# Patient Record
Sex: Male | Born: 1935
Health system: Southern US, Community
[De-identification: ages and names within clinical notes are randomized; demographics above are authoritative.]

## PROBLEM LIST (undated history)

## (undated) DIAGNOSIS — G8929 Other chronic pain: Secondary | ICD-10-CM

## (undated) DIAGNOSIS — R609 Edema, unspecified: Secondary | ICD-10-CM

## (undated) DIAGNOSIS — G20 Parkinson's disease: Secondary | ICD-10-CM

## (undated) DIAGNOSIS — G20A1 Parkinson's disease without dyskinesia, without mention of fluctuations: Secondary | ICD-10-CM

## (undated) DIAGNOSIS — G629 Polyneuropathy, unspecified: Secondary | ICD-10-CM

## (undated) DIAGNOSIS — I10 Essential (primary) hypertension: Secondary | ICD-10-CM

## (undated) HISTORY — DX: Other chronic pain: G89.29

## (undated) HISTORY — PX: BRAIN SURGERY: SHX531

## (undated) HISTORY — PX: BACK SURGERY: SHX140

## (undated) HISTORY — PX: CORONARY ANGIOPLASTY WITH STENT PLACEMENT: SHX49

---

## 2012-12-16 ENCOUNTER — Ambulatory Visit (HOSPITAL_COMMUNITY)
Admission: RE | Admit: 2012-12-16 | Discharge: 2012-12-16 | Disposition: A | Discharge status: Home / Self Care | Payer: Medicare HMO | Source: Ambulatory Visit | Attending: Family Medicine | Admitting: Family Medicine

## 2012-12-16 ENCOUNTER — Other Ambulatory Visit (HOSPITAL_COMMUNITY): Payer: Self-pay | Admitting: Family Medicine

## 2012-12-16 DIAGNOSIS — M545 Low back pain, unspecified: Secondary | ICD-10-CM | POA: Insufficient documentation

## 2012-12-16 DIAGNOSIS — M5137 Other intervertebral disc degeneration, lumbosacral region: Secondary | ICD-10-CM | POA: Insufficient documentation

## 2012-12-16 DIAGNOSIS — R29898 Other symptoms and signs involving the musculoskeletal system: Secondary | ICD-10-CM

## 2012-12-16 DIAGNOSIS — M51379 Other intervertebral disc degeneration, lumbosacral region without mention of lumbar back pain or lower extremity pain: Secondary | ICD-10-CM | POA: Insufficient documentation

## 2013-05-08 ENCOUNTER — Other Ambulatory Visit (HOSPITAL_COMMUNITY): Payer: Self-pay | Admitting: Family Medicine

## 2013-05-08 DIAGNOSIS — R29898 Other symptoms and signs involving the musculoskeletal system: Secondary | ICD-10-CM

## 2013-05-08 DIAGNOSIS — M545 Low back pain: Secondary | ICD-10-CM

## 2013-05-09 ENCOUNTER — Other Ambulatory Visit (HOSPITAL_COMMUNITY): Payer: Self-pay | Admitting: Family Medicine

## 2013-05-09 ENCOUNTER — Ambulatory Visit (HOSPITAL_COMMUNITY)
Admission: RE | Admit: 2013-05-09 | Discharge: 2013-05-09 | Disposition: A | Discharge status: Home / Self Care | Payer: Medicare HMO | Source: Ambulatory Visit | Attending: Family Medicine | Admitting: Family Medicine

## 2013-05-09 DIAGNOSIS — M545 Low back pain, unspecified: Secondary | ICD-10-CM

## 2013-05-09 DIAGNOSIS — R29898 Other symptoms and signs involving the musculoskeletal system: Secondary | ICD-10-CM

## 2013-05-09 DIAGNOSIS — Z9889 Other specified postprocedural states: Secondary | ICD-10-CM | POA: Insufficient documentation

## 2013-05-09 DIAGNOSIS — M47817 Spondylosis without myelopathy or radiculopathy, lumbosacral region: Secondary | ICD-10-CM | POA: Insufficient documentation

## 2013-05-09 DIAGNOSIS — R5381 Other malaise: Secondary | ICD-10-CM | POA: Insufficient documentation

## 2013-05-09 DIAGNOSIS — M8448XA Pathological fracture, other site, initial encounter for fracture: Secondary | ICD-10-CM | POA: Insufficient documentation

## 2013-05-10 ENCOUNTER — Other Ambulatory Visit (HOSPITAL_COMMUNITY): Payer: Medicare HMO

## 2013-05-19 ENCOUNTER — Other Ambulatory Visit: Payer: Self-pay | Admitting: Family Medicine

## 2013-05-19 DIAGNOSIS — M5136 Other intervertebral disc degeneration, lumbar region: Secondary | ICD-10-CM

## 2013-06-07 ENCOUNTER — Ambulatory Visit
Admission: RE | Admit: 2013-06-07 | Discharge: 2013-06-07 | Disposition: A | Discharge status: Home / Self Care | Payer: Medicare HMO | Source: Ambulatory Visit | Attending: Family Medicine | Admitting: Family Medicine

## 2013-06-07 DIAGNOSIS — M5136 Other intervertebral disc degeneration, lumbar region: Secondary | ICD-10-CM

## 2013-06-07 MED ORDER — IOHEXOL 180 MG/ML  SOLN
1.0000 mL | Freq: Once | INTRAMUSCULAR | Status: AC | PRN
  Administered 2013-06-07: 1 mL via EPIDURAL

## 2013-06-07 MED ORDER — METHYLPREDNISOLONE ACETATE 40 MG/ML INJ SUSP (RADIOLOG
120.0000 mg | Freq: Once | INTRAMUSCULAR | Status: AC
  Administered 2013-06-07: 120 mg via EPIDURAL

## 2013-06-28 ENCOUNTER — Inpatient Hospital Stay (HOSPITAL_COMMUNITY)
Admission: EM | Admit: 2013-06-28 | Discharge: 2013-07-01 | DRG: 558 | Disposition: A | Discharge status: Skilled Nursing Facility | Payer: Medicare HMO | Attending: Family Medicine | Admitting: Family Medicine

## 2013-06-28 DIAGNOSIS — G20A1 Parkinson's disease without dyskinesia, without mention of fluctuations: Secondary | ICD-10-CM | POA: Diagnosis present

## 2013-06-28 DIAGNOSIS — R531 Weakness: Secondary | ICD-10-CM

## 2013-06-28 DIAGNOSIS — D696 Thrombocytopenia, unspecified: Secondary | ICD-10-CM | POA: Diagnosis present

## 2013-06-28 DIAGNOSIS — M5137 Other intervertebral disc degeneration, lumbosacral region: Secondary | ICD-10-CM | POA: Diagnosis present

## 2013-06-28 DIAGNOSIS — E538 Deficiency of other specified B group vitamins: Secondary | ICD-10-CM | POA: Diagnosis present

## 2013-06-28 DIAGNOSIS — R7989 Other specified abnormal findings of blood chemistry: Secondary | ICD-10-CM

## 2013-06-28 DIAGNOSIS — N179 Acute kidney failure, unspecified: Secondary | ICD-10-CM | POA: Diagnosis present

## 2013-06-28 DIAGNOSIS — R17 Unspecified jaundice: Secondary | ICD-10-CM

## 2013-06-28 DIAGNOSIS — G629 Polyneuropathy, unspecified: Secondary | ICD-10-CM

## 2013-06-28 DIAGNOSIS — M47817 Spondylosis without myelopathy or radiculopathy, lumbosacral region: Secondary | ICD-10-CM | POA: Diagnosis present

## 2013-06-28 DIAGNOSIS — Z9181 History of falling: Secondary | ICD-10-CM

## 2013-06-28 DIAGNOSIS — I10 Essential (primary) hypertension: Secondary | ICD-10-CM | POA: Diagnosis present

## 2013-06-28 DIAGNOSIS — W19XXXA Unspecified fall, initial encounter: Secondary | ICD-10-CM

## 2013-06-28 DIAGNOSIS — R740 Nonspecific elevation of levels of transaminase and lactic acid dehydrogenase [LDH]: Secondary | ICD-10-CM

## 2013-06-28 DIAGNOSIS — M51379 Other intervertebral disc degeneration, lumbosacral region without mention of lumbar back pain or lower extremity pain: Secondary | ICD-10-CM | POA: Diagnosis present

## 2013-06-28 DIAGNOSIS — M6282 Rhabdomyolysis: Principal | ICD-10-CM | POA: Diagnosis present

## 2013-06-28 DIAGNOSIS — D72829 Elevated white blood cell count, unspecified: Secondary | ICD-10-CM | POA: Diagnosis present

## 2013-06-28 DIAGNOSIS — N390 Urinary tract infection, site not specified: Secondary | ICD-10-CM | POA: Diagnosis present

## 2013-06-28 DIAGNOSIS — M1990 Unspecified osteoarthritis, unspecified site: Secondary | ICD-10-CM | POA: Diagnosis present

## 2013-06-28 DIAGNOSIS — G20 Parkinson's disease: Secondary | ICD-10-CM | POA: Diagnosis present

## 2013-06-28 DIAGNOSIS — D649 Anemia, unspecified: Secondary | ICD-10-CM | POA: Diagnosis present

## 2013-06-28 DIAGNOSIS — N289 Disorder of kidney and ureter, unspecified: Secondary | ICD-10-CM

## 2013-06-28 DIAGNOSIS — Z8249 Family history of ischemic heart disease and other diseases of the circulatory system: Secondary | ICD-10-CM

## 2013-06-28 DIAGNOSIS — R609 Edema, unspecified: Secondary | ICD-10-CM | POA: Diagnosis present

## 2013-06-28 DIAGNOSIS — G589 Mononeuropathy, unspecified: Secondary | ICD-10-CM | POA: Diagnosis present

## 2013-06-28 DIAGNOSIS — R7401 Elevation of levels of liver transaminase levels: Secondary | ICD-10-CM

## 2013-06-28 HISTORY — DX: Essential (primary) hypertension: I10

## 2013-06-28 HISTORY — DX: Parkinson's disease: G20

## 2013-06-28 HISTORY — DX: Parkinson's disease without dyskinesia, without mention of fluctuations: G20.A1

## 2013-06-28 HISTORY — DX: Edema, unspecified: R60.9

## 2013-06-28 HISTORY — DX: Polyneuropathy, unspecified: G62.9

## 2013-06-28 MED ORDER — SODIUM CHLORIDE 0.9 % IV SOLN: Freq: Once | INTRAVENOUS | Status: DC

## 2013-06-28 NOTE — ED Notes (Signed)
Pt in by ems from home with apparent fall early in the day, may have been on the floor for about 12 hours per family.  Pt c/o generalized weakness and lower back pain (chronic) and pt states he has been having right flank pain, low grade feves.

## 2013-06-28 NOTE — ED Provider Notes (Signed)
CSN: [id number]     Arrival date & time 06/28/13  2337 History  This chart was scribed for Delora Fuel, MD by Rolanda Lundborg, ED Scribe. This patient was seen in room APA01/APA01 and the patient's care was started at 11:44 PM.   Chief Complaint  Patient presents with  . Fall  . Weakness   The history is provided by the patient. No language interpreter was used.   HPI Comments: Julian Ramirez is a 80 y.o. male brought in by ambulance who presents to the Emergency Department complaining of falling out of bed earlier today and increased weakness in the last 2 days. Per son, pt may have been on the floor for about 12 hours before he was found. Pt states he was only on the floor for 2 hours. Pt states his legs gave out on him. He was verbally diagnosed with Parkinson's at the [place] 6 weeks ago. Son states his weakness has been ongoing but has become significantly worse in the last 2 days. Son reports his speech is more slurred than normal. Pt reports he urinated in the bed last night. Pt also reports lower back pain that is chronic in nature. Son reports he felt hot since yesterday but has not taken his temperature.   PCP - Lanette Hampshire, MD   No past medical history on file. No past surgical history on file. No family history on file. History  Substance Use Topics  . Smoking status: Not on file  . Smokeless tobacco: Not on file  . Alcohol Use: Not on file    Review of Systems  Constitutional: Positive for fever.  Genitourinary: Positive for enuresis.  Musculoskeletal: Positive for back pain.  Neurological: Positive for weakness.  All other systems reviewed and are negative.    Allergies  Penicillins  Home Medications  No current outpatient prescriptions on file. BP 109/71  Pulse 80  Temp(Src) 99.7 F (37.6 C) (Oral)  Resp 20  Ht 6\' 1"  (1.854 m)  Wt 210 lb (95.255 kg)  BMI 27.71 kg/m2  SpO2 95% Physical Exam  Nursing note and vitals reviewed. Constitutional: He is oriented  to person, place, and time. He appears well-developed and well-nourished. No distress.  HENT:  Head: Normocephalic and atraumatic.  Mucous membranes dry.   Eyes: EOM are normal.  Neck: Neck supple. No JVD present. No tracheal deviation present.  Cardiovascular: Normal rate and regular rhythm.   No murmur heard. Pulmonary/Chest: Effort normal and breath sounds normal. He has no wheezes. He has no rales.  Abdominal: Soft. Bowel sounds are normal. He exhibits no mass. There is no tenderness.  Musculoskeletal: Normal range of motion.  1+ pedal edema. Mild tenderness mid lumbar area.   Lymphadenopathy:    He has no cervical adenopathy.  Neurological: He is alert and oriented to person, place, and time. No cranial nerve deficit.  Slow to answer questions but oriented. Speech is mildly dysarthric. Moderate tremor present. Mild weakness of right arm 4/5. Moderate weakness of right leg 3/5. Negative pronator drift.   Skin: Skin is warm and dry. No rash noted.  Psychiatric: He has a normal mood and affect. His behavior is normal.    ED Course  Procedures (including critical care time) Medications  0.9 %  sodium chloride infusion (not administered)    DIAGNOSTIC STUDIES: Oxygen Saturation is 95% on RA, normal by my interpretation.    COORDINATION OF CARE: 11:53 PM- Discussed treatment plan with pt which includes basic labs, UA, imaging. Pt agrees  to plan.    Labs Review Results for orders placed during the hospital encounter of 06/28/13  CBC WITH DIFFERENTIAL      Result Value Range   WBC 12.0 (*) 4.0 - 10.5 K/uL   RBC 3.89 (*) 4.22 - 5.81 MIL/uL   Hemoglobin 12.3 (*) 13.0 - 17.0 g/dL   HCT 35.6 (*) 39.0 - 52.0 %   MCV 91.5  78.0 - 100.0 fL   MCH 31.6  26.0 - 34.0 pg   MCHC 34.6  30.0 - 36.0 g/dL   RDW 13.2  11.5 - 15.5 %   Platelets 126 (*) 150 - 400 K/uL   Neutrophils Relative % 94 (*) 43 - 77 %   Neutro Abs 11.2 (*) 1.7 - 7.7 K/uL   Lymphocytes Relative 3 (*) 12 - 46 %    Lymphs Abs 0.3 (*) 0.7 - 4.0 K/uL   Monocytes Relative 3  3 - 12 %   Monocytes Absolute 0.4  0.1 - 1.0 K/uL   Eosinophils Relative 0  0 - 5 %   Eosinophils Absolute 0.0  0.0 - 0.7 K/uL   Basophils Relative 0  0 - 1 %   Basophils Absolute 0.0  0.0 - 0.1 K/uL  COMPREHENSIVE METABOLIC PANEL      Result Value Range   Sodium 141  137 - 147 mEq/L   Potassium 4.3  3.7 - 5.3 mEq/L   Chloride 102  96 - 112 mEq/L   CO2 25  19 - 32 mEq/L   Glucose, Bld 119 (*) 70 - 99 mg/dL   BUN 38 (*) 6 - 23 mg/dL   Creatinine, Ser 1.56 (*) 0.50 - 1.35 mg/dL   Calcium 9.1  8.4 - 10.5 mg/dL   Total Protein 7.1  6.0 - 8.3 g/dL   Albumin 3.6  3.5 - 5.2 g/dL   AST 60 (*) 0 - 37 U/L   ALT 17  0 - 53 U/L   Alkaline Phosphatase 51  39 - 117 U/L   Total Bilirubin 1.9 (*) 0.3 - 1.2 mg/dL   GFR calc non Af Amer 39 (*) >90 mL/min   GFR calc Af Amer 45 (*) >90 mL/min  URINALYSIS, ROUTINE W REFLEX MICROSCOPIC      Result Value Range   Color, Urine YELLOW  YELLOW   APPearance CLEAR  CLEAR   Specific Gravity, Urine 1.025  1.005 - 1.030   pH 6.0  5.0 - 8.0   Glucose, UA NEGATIVE  NEGATIVE mg/dL   Hgb urine dipstick LARGE (*) NEGATIVE   Bilirubin Urine NEGATIVE  NEGATIVE   Ketones, ur NEGATIVE  NEGATIVE mg/dL   Protein, ur 100 (*) NEGATIVE mg/dL   Urobilinogen, UA 0.2  0.0 - 1.0 mg/dL   Nitrite NEGATIVE  NEGATIVE   Leukocytes, UA NEGATIVE  NEGATIVE  LACTIC ACID, PLASMA      Result Value Range   Lactic Acid, Venous 2.7 (*) 0.5 - 2.2 mmol/L  CK      Result Value Range   Total CK 3665 (*) 7 - 232 U/L  URINE MICROSCOPIC-ADD ON      Result Value Range   Squamous Epithelial / LPF MANY (*) RARE   RBC / HPF 0-2  <3 RBC/hpf   Bacteria, UA MANY (*) RARE   Urine-Other MUCOUS PRESENT     Imaging Review Ct Head Wo Contrast  06/29/2013   CLINICAL DATA:  Fall.  Weakness.  EXAM: CT HEAD WITHOUT CONTRAST  TECHNIQUE: Contiguous axial images were  obtained from the base of the skull through the vertex without intravenous  contrast.  COMPARISON:  None.  FINDINGS: Skull and Sinuses:Presumed mucous retention cyst in the anterior left maxillary antrum. Previous biparietal bur holes, often for subdural drainage.  Orbits: Bilateral cataract resection.  Brain: No evidence of acute abnormality, such as acute infarction, hemorrhage, hydrocephalus, or mass lesion/mass effect. Cerebral volume loss which is age appropriate. 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  IMPRESSION: No evidence of acute intracranial disease.   Electronically Signed   By: Jorje Guild M.D.   On: 06/29/2013 02:46   Dg Chest Port 1 View  06/29/2013   CLINICAL DATA:  Weakness.  EXAM: PORTABLE CHEST - 1 VIEW  COMPARISON:  None.  FINDINGS: Borderline cardiomegaly. Tortuous thoracic aorta. Crease markings at the bases, without definite consolidation. No effusion or pneumothorax.  IMPRESSION: Mild basilar atelectasis.   Electronically Signed   By: Jorje Guild M.D.   On: 06/29/2013 00:58   Images viewed by me.  EKG Interpretation    Date/Time:  Thursday June 29 2013 00:02:14 EST Ventricular Rate:  73 PR Interval:  168 QRS Duration: 86 QT Interval:  376 QTC Calculation: 414 R Axis:   11 Text Interpretation:  Normal sinus rhythm Normal ECG No previous ECGs available Confirmed by [facility]  MD, Olumide Dolinger ([id number]) on 06/29/2013 12:30:13 AM            MDM   1. Rhabdomyolysis   2. Weakness   3. Renal insufficiency   4. Anemia   5. Thrombocytopenia   6. Elevated bilirubin   7. Elevated transaminase level   8. Elevated lactic acid level    Weakness of uncertain cause. It does seem to be some asymmetry to the weakness in concerned about possibility of an underlying stroke. He was laying on the floor for an extended period of time she needs to be evaluated for possible rhabdomyolysis. Screening labs were obtained including CK and lactic acid and he was sent for CT of head and cervical spine and chest x-ray obtained.  Workup  is significant for elevated CK consistent with rhabdomyolysis. Melina Modena is noted as well as many epithelial cells so is uncertain if this actually represents UTI. Mild anemia and mild renal insufficiency are present without baseline values with which to compare. He is started on IV hydration. Case is discussed with Dr. Reece Levy [facility] who agrees to admit the patient. Mild elevation of lactic acid should be treated adequately with his IV hydration.  I personally performed the services described in this documentation, which was scribed in my presence. The recorded information has been reviewed and is accurate.     Delora Fuel, MD [id number]

## 2013-06-29 ENCOUNTER — Encounter (HOSPITAL_COMMUNITY): Payer: Self-pay | Admitting: Emergency Medicine

## 2013-06-29 ENCOUNTER — Emergency Department (HOSPITAL_COMMUNITY): Payer: Medicare HMO

## 2013-06-29 ENCOUNTER — Inpatient Hospital Stay (HOSPITAL_COMMUNITY): Payer: Medicare HMO

## 2013-06-29 ENCOUNTER — Inpatient Hospital Stay (HOSPITAL_COMMUNITY)
Admit: 2013-06-29 | Discharge: 2013-06-29 | Disposition: A | Discharge status: Home / Self Care | Payer: Medicare HMO | Attending: Neurology | Admitting: Neurology

## 2013-06-29 DIAGNOSIS — G20 Parkinson's disease: Secondary | ICD-10-CM | POA: Diagnosis present

## 2013-06-29 DIAGNOSIS — D72829 Elevated white blood cell count, unspecified: Secondary | ICD-10-CM | POA: Diagnosis present

## 2013-06-29 DIAGNOSIS — D696 Thrombocytopenia, unspecified: Secondary | ICD-10-CM | POA: Diagnosis present

## 2013-06-29 DIAGNOSIS — I359 Nonrheumatic aortic valve disorder, unspecified: Secondary | ICD-10-CM

## 2013-06-29 DIAGNOSIS — R609 Edema, unspecified: Secondary | ICD-10-CM | POA: Diagnosis present

## 2013-06-29 DIAGNOSIS — N179 Acute kidney failure, unspecified: Secondary | ICD-10-CM | POA: Diagnosis present

## 2013-06-29 DIAGNOSIS — G629 Polyneuropathy, unspecified: Secondary | ICD-10-CM | POA: Diagnosis present

## 2013-06-29 DIAGNOSIS — R5381 Other malaise: Secondary | ICD-10-CM

## 2013-06-29 DIAGNOSIS — D649 Anemia, unspecified: Secondary | ICD-10-CM | POA: Diagnosis present

## 2013-06-29 DIAGNOSIS — I10 Essential (primary) hypertension: Secondary | ICD-10-CM

## 2013-06-29 DIAGNOSIS — M6282 Rhabdomyolysis: Principal | ICD-10-CM

## 2013-06-29 DIAGNOSIS — G20A1 Parkinson's disease without dyskinesia, without mention of fluctuations: Secondary | ICD-10-CM | POA: Diagnosis present

## 2013-06-29 DIAGNOSIS — R5383 Other fatigue: Secondary | ICD-10-CM

## 2013-06-29 DIAGNOSIS — W19XXXA Unspecified fall, initial encounter: Secondary | ICD-10-CM

## 2013-06-29 DIAGNOSIS — R531 Weakness: Secondary | ICD-10-CM | POA: Diagnosis present

## 2013-06-29 LAB — URINALYSIS, ROUTINE W REFLEX MICROSCOPIC
BILIRUBIN URINE: NEGATIVE
Glucose, UA: NEGATIVE mg/dL
Ketones, ur: NEGATIVE mg/dL
Leukocytes, UA: NEGATIVE
Nitrite: NEGATIVE
PH: 6 (ref 5.0–8.0)
Protein, ur: 100 mg/dL — AB
Specific Gravity, Urine: 1.025 (ref 1.005–1.030)
Urobilinogen, UA: 0.2 mg/dL (ref 0.0–1.0)

## 2013-06-29 LAB — COMPREHENSIVE METABOLIC PANEL
ALT: 17 U/L (ref 0–53)
AST: 60 U/L — AB (ref 0–37)
Albumin: 3.6 g/dL (ref 3.5–5.2)
Alkaline Phosphatase: 51 U/L (ref 39–117)
BUN: 38 mg/dL — ABNORMAL HIGH (ref 6–23)
CO2: 25 mEq/L (ref 19–32)
CREATININE: 1.56 mg/dL — AB (ref 0.50–1.35)
Calcium: 9.1 mg/dL (ref 8.4–10.5)
Chloride: 102 mEq/L (ref 96–112)
GFR calc non Af Amer: 39 mL/min — ABNORMAL LOW (ref >90)
GFR, EST AFRICAN AMERICAN: 45 mL/min — AB (ref >90)
Glucose, Bld: 119 mg/dL — ABNORMAL HIGH (ref 70–99)
Potassium: 4.3 mEq/L (ref 3.7–5.3)
Sodium: 141 mEq/L (ref 137–147)
TOTAL PROTEIN: 7.1 g/dL (ref 6.0–8.3)
Total Bilirubin: 1.9 mg/dL — ABNORMAL HIGH (ref 0.3–1.2)

## 2013-06-29 LAB — IRON AND TIBC
Iron: <10 ug/dL — ABNORMAL LOW (ref 42–135)
UIBC: 227 ug/dL (ref 125–400)

## 2013-06-29 LAB — CK
CK TOTAL: 3665 U/L — AB (ref 7–232)
Total CK: 4830 U/L — ABNORMAL HIGH (ref 7–232)

## 2013-06-29 LAB — BASIC METABOLIC PANEL
BUN: 37 mg/dL — AB (ref 6–23)
CO2: 20 mEq/L (ref 19–32)
CREATININE: 1.56 mg/dL — AB (ref 0.50–1.35)
Calcium: 8.5 mg/dL (ref 8.4–10.5)
Chloride: 104 mEq/L (ref 96–112)
GFR calc non Af Amer: 39 mL/min — ABNORMAL LOW (ref >90)
GFR, EST AFRICAN AMERICAN: 45 mL/min — AB (ref >90)
Glucose, Bld: 114 mg/dL — ABNORMAL HIGH (ref 70–99)
Potassium: 3.7 mEq/L (ref 3.7–5.3)
Sodium: 140 mEq/L (ref 137–147)

## 2013-06-29 LAB — INFLUENZA PANEL BY PCR (TYPE A & B)
H1N1 flu by pcr: NOT DETECTED
Influenza A By PCR: NEGATIVE
Influenza B By PCR: NEGATIVE

## 2013-06-29 LAB — RETICULOCYTES
RBC.: 3.44 MIL/uL — AB (ref 4.22–5.81)
RETIC COUNT ABSOLUTE: 48.2 10*3/uL (ref 19.0–186.0)
Retic Ct Pct: 1.4 % (ref 0.4–3.1)

## 2013-06-29 LAB — CBC WITH DIFFERENTIAL/PLATELET
BASOS ABS: 0 10*3/uL (ref 0.0–0.1)
Basophils Relative: 0 % (ref 0–1)
EOS ABS: 0 10*3/uL (ref 0.0–0.7)
EOS PCT: 0 % (ref 0–5)
HEMATOCRIT: 35.6 % — AB (ref 39.0–52.0)
Hemoglobin: 12.3 g/dL — ABNORMAL LOW (ref 13.0–17.0)
Lymphocytes Relative: 3 % — ABNORMAL LOW (ref 12–46)
Lymphs Abs: 0.3 10*3/uL — ABNORMAL LOW (ref 0.7–4.0)
MCH: 31.6 pg (ref 26.0–34.0)
MCHC: 34.6 g/dL (ref 30.0–36.0)
MCV: 91.5 fL (ref 78.0–100.0)
MONO ABS: 0.4 10*3/uL (ref 0.1–1.0)
Monocytes Relative: 3 % (ref 3–12)
Neutro Abs: 11.2 10*3/uL — ABNORMAL HIGH (ref 1.7–7.7)
Neutrophils Relative %: 94 % — ABNORMAL HIGH (ref 43–77)
Platelets: 126 10*3/uL — ABNORMAL LOW (ref 150–400)
RBC: 3.89 MIL/uL — ABNORMAL LOW (ref 4.22–5.81)
RDW: 13.2 % (ref 11.5–15.5)
WBC: 12 10*3/uL — ABNORMAL HIGH (ref 4.0–10.5)

## 2013-06-29 LAB — GLUCOSE, CAPILLARY: Glucose-Capillary: 117 mg/dL — ABNORMAL HIGH (ref 70–99)

## 2013-06-29 LAB — URINE MICROSCOPIC-ADD ON

## 2013-06-29 LAB — CBC
HCT: 31.2 % — ABNORMAL LOW (ref 39.0–52.0)
Hemoglobin: 10.8 g/dL — ABNORMAL LOW (ref 13.0–17.0)
MCH: 31.4 pg (ref 26.0–34.0)
MCHC: 34.6 g/dL (ref 30.0–36.0)
MCV: 90.7 fL (ref 78.0–100.0)
RBC: 3.44 MIL/uL — ABNORMAL LOW (ref 4.22–5.81)
RDW: 13.2 % (ref 11.5–15.5)
WBC: 10.1 10*3/uL (ref 4.0–10.5)

## 2013-06-29 LAB — LIPID PANEL
CHOLESTEROL: 141 mg/dL (ref 0–200)
HDL: 54 mg/dL (ref >39)
LDL Cholesterol: 64 mg/dL (ref 0–99)
TRIGLYCERIDES: 113 mg/dL (ref <150)
Total CHOL/HDL Ratio: 2.6 RATIO
VLDL: 23 mg/dL (ref 0–40)

## 2013-06-29 LAB — LACTIC ACID, PLASMA: Lactic Acid, Venous: 2.7 mmol/L — ABNORMAL HIGH (ref 0.5–2.2)

## 2013-06-29 LAB — HEMOGLOBIN A1C
Hgb A1c MFr Bld: 5.4 % (ref <5.7)
Mean Plasma Glucose: 108 mg/dL (ref <117)

## 2013-06-29 LAB — FOLATE: Folate: 17.8 ng/mL

## 2013-06-29 LAB — MRSA PCR SCREENING: MRSA by PCR: NEGATIVE

## 2013-06-29 LAB — VITAMIN B12: Vitamin B-12: 188 pg/mL — ABNORMAL LOW (ref 211–911)

## 2013-06-29 LAB — FERRITIN: FERRITIN: 351 ng/mL — AB (ref 22–322)

## 2013-06-29 MED ORDER — SODIUM CHLORIDE 0.9 % IV SOLN
1000.0000 mL | INTRAVENOUS | Status: DC
  Administered 2013-06-29: 1000 mL via INTRAVENOUS

## 2013-06-29 MED ORDER — CIPROFLOXACIN IN D5W 400 MG/200ML IV SOLN
400.0000 mg | Freq: Two times a day (BID) | INTRAVENOUS | Status: DC
  Administered 2013-06-29 – 2013-06-30 (×4): 400 mg via INTRAVENOUS
  Filled 2013-06-29 (×9): qty 200

## 2013-06-29 MED ORDER — SODIUM CHLORIDE 0.9 % IV SOLN
1000.0000 mL | Freq: Once | INTRAVENOUS | Status: AC
  Administered 2013-06-29: 1000 mL via INTRAVENOUS

## 2013-06-29 MED ORDER — SODIUM CHLORIDE 0.9 % IV SOLN: INTRAVENOUS | Status: AC

## 2013-06-29 MED ORDER — ENOXAPARIN SODIUM 40 MG/0.4ML ~~LOC~~ SOLN
40.0000 mg | SUBCUTANEOUS | Status: DC
  Administered 2013-06-29 – 2013-07-01 (×3): 40 mg via SUBCUTANEOUS
  Filled 2013-06-29 (×3): qty 0.4

## 2013-06-29 MED ORDER — GABAPENTIN 300 MG PO CAPS
300.0000 mg | ORAL_CAPSULE | Freq: Two times a day (BID) | ORAL | Status: DC
  Administered 2013-06-29 – 2013-07-01 (×4): 300 mg via ORAL
  Filled 2013-06-29 (×4): qty 1

## 2013-06-29 MED ORDER — SODIUM CHLORIDE 0.9 % IV SOLN
INTRAVENOUS | Status: AC
  Administered 2013-06-29: 14:00:00 via INTRAVENOUS

## 2013-06-29 MED ORDER — ACETAMINOPHEN 325 MG PO TABS
650.0000 mg | ORAL_TABLET | Freq: Four times a day (QID) | ORAL | Status: DC | PRN
  Administered 2013-06-29 – 2013-06-30 (×4): 650 mg via ORAL
  Filled 2013-06-29 (×4): qty 2

## 2013-06-29 MED ORDER — SODIUM CHLORIDE 0.9 % IV BOLUS (SEPSIS)
1000.0000 mL | Freq: Once | INTRAVENOUS | Status: AC
  Administered 2013-06-29: 1000 mL via INTRAVENOUS

## 2013-06-29 MED ORDER — ASPIRIN 325 MG PO TABS
325.0000 mg | ORAL_TABLET | Freq: Every day | ORAL | Status: DC
  Administered 2013-06-29 – 2013-07-01 (×3): 325 mg via ORAL
  Filled 2013-06-29 (×3): qty 1

## 2013-06-29 MED ORDER — TRAMADOL HCL 50 MG PO TABS
25.0000 mg | ORAL_TABLET | Freq: Four times a day (QID) | ORAL | Status: DC | PRN
  Administered 2013-06-29 – 2013-06-30 (×4): 25 mg via ORAL
  Filled 2013-06-29 (×4): qty 1

## 2013-06-29 NOTE — Progress Notes (Signed)
Patient returned from [place] and is non-responsive to painful stimuli. Patient's VS are WNL, HR 68, BP 97/50, RR 18 and SO2 92% on Room air. MD noitfied and orders given to continue to monitor VS and to consult neurology. Neurologies added to treatment team and called and notified.

## 2013-06-29 NOTE — Care Management Note (Signed)
    Page 1 of 1   06/29/2013     3:37:40 PM   CARE MANAGEMENT NOTE 06/29/2013  Patient:  Julian Ramirez, Julian Ramirez   Account Number:  [id number]  Date Initiated:  06/29/2013  Documentation initiated by:  Theophilus Kinds  Subjective/Objective Assessment:   Pt admitted from home with falls and rhabdomylosis. Pt lives alone but has a son who is very active in the care of the pt. Pt requires mod assist with ADL's. Pt is connected with [facility] but refuses transfer. Forms faxed.     Action/Plan:   PT recommends SNF. CSW is aware and will initiate bed search.   Anticipated DC Date:  07/03/2013   Anticipated DC Plan:  SKILLED NURSING FACILITY  In-house referral  Clinical Social Worker      DC Planning Services  CM consult      Choice offered to / List presented to:             Status of service:  Completed, signed off Medicare Important Message given?   (If response is "NO", the following Medicare IM given date fields will be blank) Date Medicare IM given:   Date Additional Medicare IM given:    Discharge Disposition:  [place]  Per UR Regulation:    If discussed at Long Length of Stay Meetings, dates discussed:    Comments:  06/29/13 [place], RN BSN CM

## 2013-06-29 NOTE — Consult Note (Signed)
Bostonia A. Merlene Laughter, MD     [email or web address]          Julian Ramirez is an 80 y.o. male.   ASSESSMENT/PLAN: Multifactorial gait impairment. Areas of concern include the following: Cervical myelopathy or other types of myelopathy, moderate Parkinson's disease, vitamin B12 neuropathy/myelopathy, and asymmetric particularly from multiarticular osteoarthritis. The patient will be started on vitamin B12 replacement. This was done vigorously while hospitalized. Cervical spine MRI will also be obtained. The patient will be started on carbidopa/levodopa and titrated to effect and/or side effects. This should be done gradually however.  Unexplained headaches for many years possibly due to tension type.  Low back pain likely multifactorial including lumbar degenerative disc and spondylosis. Recent MRI and examination does not indicate that this is playing a significant role in the patient's gait although the pain could be contributing. The MRI findings however a modest.  This 80 year old white male who reports worsening leg weakness and gait instabilities/gait ataxia over the last several months. The patient has been diagnosed with Parkinson disease 6 months ago. He was started on Symmetrel but reported that the medication caused significant dry mouth and other side effects without benefit. The patient consequently discontinued the medication about 6 weeks ago. The patient reports that he has had tremors and this is why he was started on the medication although the patient himself could not appreciate the tremors. He does cooperate that he has had some speaking impairment recently. He has had progressive leg weakness and gait instability. The patient went to the restroom to defecate on the day of admission and fell off the toilet. The patient apparently defecated a second time after falling. The patient does not report losing consciousness. He fell between the toilet on the wall and  was not able to move. He laid there for about 2 hours until he was able to get himself unstuck and crawed to call his son. The patient does not report headache injuries, chest pain or social breath. Again, there is no alteration of loss of consciousness. The patient reports a long-standing history of headaches which have become more frequent and more severe. He reports having headaches essentially on a daily basis. Headaches are continuous but are episodic and occurs almost every day. He has taken a lot of Tylenol and although over the counter medications. There are currently no longer working for him. The review of systems is otherwise unremarkable. The chart indicates that he may have had left-sided weakness when he fell although the patient seemed not to appreciate this.  GENERAL: This a pleasant man in no acute distress.  HEENT: Neck is supple although there is mild axial rigidity throughout including the neck. Head is normocephalic atraumatic. The patient does have mild masked face. The patient has good neck flexion.   ABDOMEN: soft  EXTREMITIES: No edema. There is significant varicosities involving the right ankle. The patient does have hammertoes and high arches suggestive of neuropathy.   BACK: Unremarkable.  SKIN: Normal by inspection.    MENTAL STATUS: Alert and oriented. The language and cognition are generally intact. Judgment and insight normal. Speech is mildly hypophonic.  CRANIAL NERVES: Pupils are equal, round and reactive to light and accommodation; extra ocular movements are full, there is no significant nystagmus; visual fields are full; upper and lower facial muscles are normal in strength and symmetric, there is no flattening of the nasolabial folds; tongue is midline; uvula is midline; shoulder elevation is normal.  MOTOR: Normal tone, bulk and strength  of the upper extremities; no pronator drift. He has marked weakness of the legs with patient previously been able to  slightly lift his legs against gravity. Legs shows slightly increase tone throughout but bulk is normal throughout.  COORDINATION: Left finger to nose is normal, right finger to nose is normal, No rest tremor; no intention tremor. There is occasional moderate amplitude moderate frequency postural tremor right upper extremity involving the wrist and forearm. There is significant cogwheeling noted only with augmentation involving the right upper extremity. None is noted of the left upper extremity. There is moderately impaired hand dexterity as indicated with finger tapping and fine finger movements on the right side. The left is good.  REFLEXES: Deep tendon reflexes are symmetrical and normal. Plantar responses are flexor bilaterally.   SENSATION: Normal to light touch and temperature.   Past Medical History  Diagnosis Date  . Hypertension   . Parkinson disease   . Neuropathy   . Edema     History reviewed. No pertinent past surgical history.  Family History  Problem Relation Age of Onset  . Heart attack Mother   . Heart attack Father     Social History:  reports that he has never smoked. He does not have any smokeless tobacco history on file. He reports that he does not drink alcohol or use illicit drugs.  Allergies:  Allergies  Allergen Reactions  . Penicillins Shortness Of Breath and Swelling    Mouth swells   . Wheat Bran     Wheat four    Medications: Prior to Admission medications   Medication Sig Start Date End Date Taking? Authorizing Provider  acetaminophen (TYLENOL) 325 MG tablet Take 650 mg by mouth every 6 (six) hours as needed.   Yes Historical Provider, MD  amantadine (SYMMETREL) 100 MG capsule Take 100 mg by mouth 2 (two) times daily.   Yes Historical Provider, MD  furosemide (LASIX) 20 MG tablet Take 20 mg by mouth.   Yes Historical Provider, MD  gabapentin (NEURONTIN) 300 MG capsule Take 300 mg by mouth 3 (three) times daily.   Yes Historical Provider, MD    potassium chloride SA (K-DUR,KLOR-CON) 20 MEQ tablet Take 20 mEq by mouth 2 (two) times daily.   Yes Historical Provider, MD  traMADol (ULTRAM) 50 MG tablet Take by mouth every 6 (six) hours as needed.   Yes Historical Provider, MD     Scheduled Meds: . sodium chloride   Intravenous STAT  . aspirin  325 mg Oral Daily  . ciprofloxacin  400 mg Intravenous Q12H  . enoxaparin (LOVENOX) injection  40 mg Subcutaneous Q24H  . gabapentin  300 mg Oral BID   Continuous Infusions: . sodium chloride 150 mL/hr at 06/29/13 1449   PRN Meds:.acetaminophen, traMADol    Blood pressure 80/49, pulse 87, temperature 98 F (36.7 C), temperature source Oral, resp. rate 17, height 6\' 1"  (1.854 m), weight 93.2 kg (205 lb 7.5 oz), SpO2 99.00%.   Results for orders placed during the hospital encounter of 06/28/13 (from the past 48 hour(s))  CBC WITH DIFFERENTIAL     Status: Abnormal   Collection Time    06/29/13 12:58 AM      Result Value Range   WBC 12.0 (*) 4.0 - 10.5 K/uL   RBC 3.89 (*) 4.22 - 5.81 MIL/uL   Hemoglobin 12.3 (*) 13.0 - 17.0 g/dL   HCT 08/27/13 (*) 60.4 - 79.9 %   MCV 91.5  78.0 - 100.0 fL   MCH  31.6  26.0 - 34.0 pg   MCHC 34.6  30.0 - 36.0 g/dL   RDW 13.2  11.5 - 15.5 %   Platelets 126 (*) 150 - 400 K/uL   Neutrophils Relative % 94 (*) 43 - 77 %   Neutro Abs 11.2 (*) 1.7 - 7.7 K/uL   Lymphocytes Relative 3 (*) 12 - 46 %   Lymphs Abs 0.3 (*) 0.7 - 4.0 K/uL   Monocytes Relative 3  3 - 12 %   Monocytes Absolute 0.4  0.1 - 1.0 K/uL   Eosinophils Relative 0  0 - 5 %   Eosinophils Absolute 0.0  0.0 - 0.7 K/uL   Basophils Relative 0  0 - 1 %   Basophils Absolute 0.0  0.0 - 0.1 K/uL  COMPREHENSIVE METABOLIC PANEL     Status: Abnormal   Collection Time    06/29/13 12:58 AM      Result Value Range   Sodium 141  137 - 147 mEq/L   Potassium 4.3  3.7 - 5.3 mEq/L   Chloride 102  96 - 112 mEq/L   CO2 25  19 - 32 mEq/L   Glucose, Bld 119 (*) 70 - 99 mg/dL   BUN 38 (*) 6 - 23 mg/dL    Creatinine, Ser 1.56 (*) 0.50 - 1.35 mg/dL   Calcium 9.1  8.4 - 10.5 mg/dL   Total Protein 7.1  6.0 - 8.3 g/dL   Albumin 3.6  3.5 - 5.2 g/dL   AST 60 (*) 0 - 37 U/L   ALT 17  0 - 53 U/L   Alkaline Phosphatase 51  39 - 117 U/L   Total Bilirubin 1.9 (*) 0.3 - 1.2 mg/dL   GFR calc non Af Amer 39 (*) >90 mL/min   GFR calc Af Amer 45 (*) >90 mL/min   Comment: (NOTE)     The eGFR has been calculated using the CKD EPI equation.     This calculation has not been validated in all clinical situations.     eGFR's persistently <90 mL/min signify possible Chronic Kidney     Disease.  LACTIC ACID, PLASMA     Status: Abnormal   Collection Time    06/29/13 12:58 AM      Result Value Range   Lactic Acid, Venous 2.7 (*) 0.5 - 2.2 mmol/L  CK     Status: Abnormal   Collection Time    06/29/13 12:58 AM      Result Value Range   Total CK 3665 (*) 7 - 232 U/L  URINALYSIS, ROUTINE W REFLEX MICROSCOPIC     Status: Abnormal   Collection Time    06/29/13  2:30 AM      Result Value Range   Color, Urine YELLOW  YELLOW   APPearance CLEAR  CLEAR   Specific Gravity, Urine 1.025  1.005 - 1.030   pH 6.0  5.0 - 8.0   Glucose, UA NEGATIVE  NEGATIVE mg/dL   Hgb urine dipstick LARGE (*) NEGATIVE   Bilirubin Urine NEGATIVE  NEGATIVE   Ketones, ur NEGATIVE  NEGATIVE mg/dL   Protein, ur 100 (*) NEGATIVE mg/dL   Urobilinogen, UA 0.2  0.0 - 1.0 mg/dL   Nitrite NEGATIVE  NEGATIVE   Leukocytes, UA NEGATIVE  NEGATIVE  URINE MICROSCOPIC-ADD ON     Status: Abnormal   Collection Time    06/29/13  2:30 AM      Result Value Range   Squamous Epithelial / LPF MANY (*)  RARE   RBC / HPF 0-2  <3 RBC/hpf   Bacteria, UA MANY (*) RARE   Urine-Other MUCOUS PRESENT    MRSA PCR SCREENING     Status: None   Collection Time    06/29/13  4:39 AM      Result Value Range   MRSA by PCR NEGATIVE  NEGATIVE   Comment:            The GeneXpert MRSA Assay (FDA     approved for NASAL specimens     only), is one component of a      comprehensive MRSA colonization     surveillance program. It is not     intended to diagnose MRSA     infection nor to guide or     monitor treatment for     MRSA infections.  INFLUENZA PANEL BY PCR (TYPE A & B, H1N1)     Status: None   Collection Time    06/29/13  5:21 AM      Result Value Range   Influenza A By PCR NEGATIVE  NEGATIVE   Influenza B By PCR NEGATIVE  NEGATIVE   H1N1 flu by pcr NOT DETECTED  NOT DETECTED   Comment:            The Xpert Flu assay (FDA approved for     nasal aspirates or washes and     nasopharyngeal swab specimens), is     intended as an aid in the diagnosis of     influenza and should not be used as     a sole basis for treatment.  HEMOGLOBIN A1C     Status: None   Collection Time    06/29/13  6:18 AM      Result Value Range   Hemoglobin A1C 5.4  <5.7 %   Comment: (NOTE)                                                                               According to the ADA Clinical Practice Recommendations for 2011, when     HbA1c is used as a screening test:      >=6.5%   Diagnostic of Diabetes Mellitus               (if abnormal result is confirmed)     5.7-6.4%   Increased risk of developing Diabetes Mellitus     References:Diagnosis and Classification of Diabetes Mellitus,Diabetes     KZSW,1093,23(FTDDU 1):S62-S69 and Standards of Medical Care in             Diabetes - 2011,Diabetes Care,2011,34 (Suppl 1):S11-S61.   Mean Plasma Glucose 108  <117 mg/dL   Comment: Performed at [facility]: None   Collection Time    06/29/13  6:18 AM      Result Value Range   Cholesterol 141  0 - 200 mg/dL   Triglycerides 113  <150 mg/dL   HDL 54  >39 mg/dL   Total CHOL/HDL Ratio 2.6     VLDL 23  0 - 40 mg/dL   LDL Cholesterol 64  0 - 99 mg/dL   Comment:  Total Cholesterol/HDL:CHD Risk     Coronary Heart Disease Risk Table                         Men   Women      1/2 Average Risk   3.4   3.3      Average Risk        5.0   4.4      2 X Average Risk   9.6   7.1      3 X Average Risk  23.4   11.0                Use the calculated Patient Ratio     above and the CHD Risk Table     to determine the patient's CHD Risk.                ATP III CLASSIFICATION (LDL):      <100     mg/dL   Optimal      100-129  mg/dL   Near or Above                        Optimal      130-159  mg/dL   Borderline      160-189  mg/dL   High      >190     mg/dL   Very High  CBC     Status: Abnormal   Collection Time    06/29/13  6:18 AM      Result Value Range   WBC 10.1  4.0 - 10.5 K/uL   RBC 3.44 (*) 4.22 - 5.81 MIL/uL   Hemoglobin 10.8 (*) 13.0 - 17.0 g/dL   HCT 31.2 (*) 39.0 - 52.0 %   MCV 90.7  78.0 - 100.0 fL   MCH 31.4  26.0 - 34.0 pg   MCHC 34.6  30.0 - 36.0 g/dL   RDW 13.2  11.5 - 15.5 %   Platelets PLATELET COUNT CONFIRMED BY SMEAR  150 - 400 K/uL   Comment: PLATELETS APPEAR DECREASED  BASIC METABOLIC PANEL     Status: Abnormal   Collection Time    06/29/13  6:18 AM      Result Value Range   Sodium 140  137 - 147 mEq/L   Potassium 3.7  3.7 - 5.3 mEq/L   Chloride 104  96 - 112 mEq/L   CO2 20  19 - 32 mEq/L   Glucose, Bld 114 (*) 70 - 99 mg/dL   BUN 37 (*) 6 - 23 mg/dL   Creatinine, Ser 1.56 (*) 0.50 - 1.35 mg/dL   Calcium 8.5  8.4 - 10.5 mg/dL   GFR calc non Af Amer 39 (*) >90 mL/min   GFR calc Af Amer 45 (*) >90 mL/min   Comment: (NOTE)     The eGFR has been calculated using the CKD EPI equation.     This calculation has not been validated in all clinical situations.     eGFR's persistently <90 mL/min signify possible Chronic Kidney     Disease.  CK     Status: Abnormal   Collection Time    06/29/13  6:18 AM      Result Value Range   Total CK 4830 (*) 7 - 232 U/L  VITAMIN B12     Status: Abnormal   Collection Time    06/29/13  6:18 AM  Result Value Range   Vitamin B-12 188 (*) 211 - 911 pg/mL   Comment: Performed at [facility]     Status: None   Collection Time     06/29/13  6:18 AM      Result Value Range   Folate 17.8     Comment: (NOTE)     Reference Ranges            Deficient:       0.4 - 3.3 ng/mL            Indeterminate:   3.4 - 5.4 ng/mL            Normal:              > 5.4 ng/mL     Performed at [facility] TIBC     Status: Abnormal   Collection Time    06/29/13  6:18 AM      Result Value Range   Iron <10 (*) 42 - 135 ug/dL   TIBC Not calculated due to Iron <10.  215 - 435 ug/dL   Saturation Ratios Not calculated due to Iron <10.  20 - 55 %   UIBC 227  125 - 400 ug/dL   Comment: Performed at [facility]     Status: Abnormal   Collection Time    06/29/13  6:18 AM      Result Value Range   Ferritin 351 (*) 22 - 322 ng/mL   Comment: Performed at [facility]     Status: Abnormal   Collection Time    06/29/13  6:18 AM      Result Value Range   Retic Ct Pct 1.4  0.4 - 3.1 %   RBC. 3.44 (*) 4.22 - 5.81 MIL/uL   Retic Count, Manual 48.2  19.0 - 186.0 K/uL  CULTURE, BLOOD (ROUTINE X 2)     Status: None   Collection Time    06/29/13  6:18 AM      Result Value Range   Specimen Description Blood     Special Requests NONE     Culture NO GROWTH <24 HRS     Report Status PENDING    CULTURE, BLOOD (ROUTINE X 2)     Status: None   Collection Time    06/29/13  6:18 AM      Result Value Range   Specimen Description Blood RIGHT ARM     Special Requests BOTTLES DRAWN AEROBIC AND ANAEROBIC 8 CC EACH     Culture NO GROWTH <24 HRS     Report Status PENDING    GLUCOSE, CAPILLARY     Status: Abnormal   Collection Time    06/29/13  7:17 AM      Result Value Range   Glucose-Capillary 117 (*) 70 - 99 mg/dL    Ct Head Wo Contrast  06/29/2013   CLINICAL DATA:  Fall.  Weakness.  EXAM: CT HEAD WITHOUT CONTRAST  TECHNIQUE: Contiguous axial images were obtained from the base of the skull through the vertex without intravenous contrast.  COMPARISON:  None.  FINDINGS: Skull and  Sinuses:Presumed mucous retention cyst in the anterior left maxillary antrum. Previous biparietal bur holes, often for subdural drainage.  Orbits: Bilateral cataract resection.  Brain: No evidence of acute abnormality, such as acute infarction, hemorrhage, hydrocephalus, or mass lesion/mass effect. Cerebral volume loss which is age appropriate. Remote small vessel infarct in the  peripheral left upper cerebellum.  IMPRESSION: No evidence of acute intracranial disease.   Electronically Signed   By: Jorje Guild M.D.   On: 06/29/2013 02:46   Mr Jodene Nam Head Wo Contrast  06/29/2013   CLINICAL DATA:  Right-sided weakness, confusion, slurred speech, and recent fall.  EXAM: MRI HEAD WITHOUT CONTRAST  MRA HEAD WITHOUT CONTRAST  TECHNIQUE: Multiplanar, multiecho pulse sequences of the brain and surrounding structures were obtained without intravenous contrast. Angiographic images of the head were obtained using MRA technique without contrast.  COMPARISON:  Head CT 06/29/2013  FINDINGS: MRI HEAD FINDINGS  There is no evidence of acute infarct. Scattered foci of T2 hyperintensity within the subcortical and deep cerebral white matter and pons are nonspecific but compatible with minimal chronic small vessel ischemic disease. There is moderate generalized cerebral atrophy. Subcentimeter left choroidal fissure cyst is incidentally noted. There is no evidence of intracranial hemorrhage, midline shift, or extra-axial fluid collection. Major intracranial vascular flow voids are unremarkable. Prior bilateral cataract surgery is noted. Mild bilateral ethmoid air cell mucosal thickening is noted. Left maxillary sinus polyp is noted.  MRA HEAD FINDINGS  Distal vertebral arteries are patent and codominant. PICA origins are patent bilaterally. Basilar artery is patent without stenosis. SCAs are patent. PCAs are unremarkable. Posterior communicating arteries are not identified. Internal carotid arteries are patent from skullbase to carotid  terminus. ACA and MCA origins are patent. Left A1 segment is hypoplastic. Visualized ACA and MCA branches are otherwise unremarkable. No intracranial aneurysm is identified.  IMPRESSION: 1. No evidence of acute infarct or other acute intracranial abnormality. 2. Moderate cerebral atrophy and minimal chronic small vessel ischemic disease. 3. No evidence of major intracranial arterial occlusion or flow-limiting stenosis.   Electronically Signed   By: Logan Bores   On: 06/29/2013 18:42   Mri Brain Without Contrast  06/29/2013   CLINICAL DATA:  Right-sided weakness, confusion, slurred speech, and recent fall.  EXAM: MRI HEAD WITHOUT CONTRAST  MRA HEAD WITHOUT CONTRAST  TECHNIQUE: Multiplanar, multiecho pulse sequences of the brain and surrounding structures were obtained without intravenous contrast. Angiographic images of the head were obtained using MRA technique without contrast.  COMPARISON:  Head CT 06/29/2013  FINDINGS: MRI HEAD FINDINGS  There is no evidence of acute infarct. Scattered foci of T2 hyperintensity within the subcortical and deep cerebral white matter and pons are nonspecific but compatible with minimal chronic small vessel ischemic disease. There is moderate generalized cerebral atrophy. Subcentimeter left choroidal fissure cyst is incidentally noted. There is no evidence of intracranial hemorrhage, midline shift, or extra-axial fluid collection. Major intracranial vascular flow voids are unremarkable. Prior bilateral cataract surgery is noted. Mild bilateral ethmoid air cell mucosal thickening is noted. Left maxillary sinus polyp is noted.  MRA HEAD FINDINGS  Distal vertebral arteries are patent and codominant. PICA origins are patent bilaterally. Basilar artery is patent without stenosis. SCAs are patent. PCAs are unremarkable. Posterior communicating arteries are not identified. Internal carotid arteries are patent from skullbase to carotid terminus. ACA and MCA origins are patent. Left A1  segment is hypoplastic. Visualized ACA and MCA branches are otherwise unremarkable. No intracranial aneurysm is identified.  IMPRESSION: 1. No evidence of acute infarct or other acute intracranial abnormality. 2. Moderate cerebral atrophy and minimal chronic small vessel ischemic disease. 3. No evidence of major intracranial arterial occlusion or flow-limiting stenosis.   Electronically Signed   By: Logan Bores   On: 06/29/2013 18:42   US Carotid Duplex Bilateral  06/29/2013  CLINICAL DATA:  TIA, history hypertension, Parkinson's  EXAM: BILATERAL CAROTID DUPLEX ULTRASOUND  TECHNIQUE: Pearline Cables scale imaging, color Doppler and duplex ultrasound were performed of bilateral carotid and vertebral arteries in the neck.  COMPARISON:  None  FINDINGS: Criteria: Quantification of carotid stenosis is based on velocity parameters that correlate the residual internal carotid diameter with NASCET-based stenosis levels, using the diameter of the distal internal carotid lumen as the denominator for stenosis measurement. Examination limited by patient's snoring throughout study.  The following velocity measurements were obtained:  RIGHT  ICA:  108/20 cm/sec  CCA:  62/2 cm/sec  SYSTOLIC ICA/CCA RATIO:  2.97  DIASTOLIC ICA/CCA RATIO:  9.89  ECA:  304 cm/sec  LEFT  ICA:  136/15 cm/sec  CCA:  21/19 cm/sec  SYSTOLIC ICA/CCA RATIO:  4.17  DIASTOLIC ICA/CCA RATIO:  4.08  ECA:  299 cm/sec  RIGHT CAROTID ARTERY: Calcified and noncalcified atherosclerotic plaque formation identified in the distal right CCA and right carotid bulb extending into the proximal right ECA and ICA. Significantly turbulent blood flow identified in proximal right ECA with high velocity jet. Turbulent blood flow also identified within proximal right ICA on waveform analysis though color Doppler imaging demonstrates fairly laminar flow. Patient arhythmic during imaging.  RIGHT VERTEBRAL ARTERY:  Patent, antegrade  LEFT CAROTID ARTERY: Calcified and noncalcified plaque  at distal left CCA extending into left ECA and left ICA. Associated turbulent blood flow on color Doppler imaging in the left ECA. Spectral broadening left ECA on waveform analysis. Fairly laminar flow on color Doppler imaging in the left ICA though mild spectral broadening is seen in the left ICA on waveform analysis. High velocity jet present in the left ECA proximally.  LEFT VERTEBRAL ARTERY:  Patent, antegrade  IMPRESSION: Plaque formation at the distal common carotid arteries, carotid bulbs and extending into the proximal internal and external carotid arteries bilaterally.  High velocity jets with turbulence or noted in the proximal external carotid arteries bilaterally suggesting stenosis.  Velocities in the right ICA correspond to a less than 50% diameter stenosis.  Velocities in the left ICA correspond to a 50-69% diameter stenosis.  Intermittent arrhythmia.   Electronically Signed   By: Lavonia Dana M.D.   On: 06/29/2013 10:08   Dg Chest Port 1 View  06/29/2013   CLINICAL DATA:  Weakness.  EXAM: PORTABLE CHEST - 1 VIEW  COMPARISON:  None.  FINDINGS: Borderline cardiomegaly. Tortuous thoracic aorta. Crease markings at the bases, without definite consolidation. No effusion or pneumothorax.  IMPRESSION: Mild basilar atelectasis.   Electronically Signed   By: Jorje Guild M.D.   On: 06/29/2013 00:58    L-SPINE MRI [id number] Status post right L4-5 and L5-S1hemilaminectomy's with expected  postoperative change.  Chronic mild L3 compression fracture. No acute fracture nor  malalignment.  Degenerative change of the lumbar spine: Mild to moderate canal  stenosis at L3-4, mild at L2-3.  Neural foraminal narrowing L2-3 through L4-5: Moderate to severe at  L4-5.      Clemens Lachman A. Merlene Laughter, M.D.  Diplomate, [employer] of Psychiatry and Neurology ( Neurology). 06/29/2013, 8:53 PM

## 2013-06-29 NOTE — Evaluation (Signed)
Physical Therapy Evaluation Patient Details Name: Julian Ramirez MRN: [id number] DOB: 1927/05/05 Today's Date: 06/29/2013 Time: 4332-9518 PT Time Calculation (min): 38 min  PT Assessment / Plan / Recommendation History of Present Illness  Pt is admitted due to right sided weakness, slurred speech and a fall at home where he laid on the floor for a number of hours before being found.  He has Parkinson's disease and peripheral neuropathy at baseline but is able to live alone next door to his son.  Per his son's report, he has been having increasing diffiuctly with ADLs at home.  Son states that they have found him to have a UTI.  CT scan was negative.  Clinical Impression   Pt was able to awaken enough in order to participate in a limited evaluation done in the bed.  He is found to be profoundly weak but weakness is symmetric.  His Parkinson's appears to be fairly advanced and son states that at times he "freezes" when walking  And has difficulty rising from sitting.  We will plan to progress to transfers tomorrow but I anticipate that he will need SNF at d/c.    PT Assessment  Patient needs continued PT services    Follow Up Recommendations  SNF    Does the patient have the potential to tolerate intense rehabilitation      Barriers to Discharge   lives next door to son    Equipment Recommendations  None recommended by PT    Recommendations for Other Services     Frequency Min 3X/week    Precautions / Restrictions Precautions Precautions: Fall Restrictions Weight Bearing Restrictions: No   Pertinent Vitals/Pain       Mobility  Bed Mobility General bed mobility comments: not tested due to somnulence    Exercises     PT Diagnosis: Difficulty walking;Generalized weakness  PT Problem List: Decreased strength;Decreased range of motion;Decreased activity tolerance;Decreased mobility;Impaired sensation;Impaired tone PT Treatment Interventions: Gait training;Functional mobility  training;Therapeutic exercise     PT Goals(Current goals can be found in the care plan section) Acute Rehab PT Goals Patient Stated Goal: retrun to home PT Goal Formulation: With patient/family Time For Goal Achievement: 07/13/13 Potential to Achieve Goals: Good  Visit Information  Last PT Received On: 06/29/13 History of Present Illness: Pt is admitted due to right sided weakness, slurred speech and a fall at home where he laid on the floor for a number of hours before being found.  He has Parkinson's disease and peripheral neuropathy at baseline but is able to live alone next door to his son.  Per his son's report, he has been having increasing diffiuctly with ADLs at home.  Son states that they have found him to have a UTI.  CT scan was negative.       Prior [place] expects to be discharged to:: Skilled nursing facility Prior Function Level of Independence: Independent Comments: sometimes uses a cane Communication Communication: No difficulties    Cognition  Cognition Arousal/Alertness: Awake/alert Behavior During Therapy: WFL for tasks assessed/performed Overall Cognitive Status: Within Functional Limits for tasks assessed    Extremity/Trunk Assessment Lower Extremity Assessment Lower Extremity Assessment: Generalized weakness (there is generalized rigidity bilaterally...strenth is symmetric)   Balance    End of Session PT - End of Session Equipment Utilized During Treatment: Oxygen Activity Tolerance: Patient tolerated treatment well Patient left: in bed;with call bell/phone within reach;with bed alarm set;with family/visitor present Nurse Communication: Mobility status  GP  Demetrios Isaacs L 06/29/2013, 1:53 PM

## 2013-06-29 NOTE — Progress Notes (Signed)
Julian Ramirez, Julian Ramirez              ACCOUNT NO.:  [id number]  MEDICAL RECORD NO.:  [id number]  LOCATION:  IC12                          FACILITY:  [facility]  PHYSICIAN:  Cinthia Rodden G. Everette Rank, MD   DATE OF BIRTH:  1927/02/10  DATE OF PROCEDURE: DATE OF DISCHARGE:                                PROGRESS NOTE   SUBJECTIVE:  This patient was admitted to the hospital following a fall. He does have Parkinson disease.  Apparently fell and was on the floor for a few hours.  Apparently, has been having increased difficulty with speech and slurring of speech and right-sided weakness.  CT of the head, no evidence of acute intracranial disease.  PHYSICAL EXAMINATION:  GENERAL:  Alert patient with blood pressure 102/50, respirations 24, pulse 87, temperature 100.5. HEENT:  Eyes PERRLA.  TMs negative.  Oropharynx benign. NECK:  Supple.  No JVD or thyroid abnormalities. LUNGS:  Clear to P and A. HEART:  Regular rhythm. ABDOMEN:  No palpable organs or masses. EXTREMITIES:  Free of edema. NEUROLOGICAL:  Cranial nerves intact.  No motor or sensory abnormalities.  ASSESSMENT:  The patient does have Parkinson disease.  No evidence of acute renal failure and rhabdomyolysis.  Right-sided weakness.  The patient's and process of workup for TIA, MRI of brain scheduled, 2D echo, and carotid Dopplers scheduled.  Continue current regimen.     Julian Houchins G. Everette Rank, MD     AGM/MEDQ  D:  06/29/2013  T:  06/29/2013  Job:  [id number]

## 2013-06-29 NOTE — Progress Notes (Signed)
*  PRELIMINARY RESULTS* Echocardiogram 2D Echocardiogram has been performed.  Callahan, Boykin 06/29/2013, 10:55 AM

## 2013-06-29 NOTE — Progress Notes (Signed)
UR chart review completed.  

## 2013-06-29 NOTE — H&P (Addendum)
Patient's PCP: Lanette Hampshire, MD   Chief Complaint: Fall, generalized weakness, and right-sided weakness  History of Present Illness: Julian Ramirez is a 80 y.o. Caucasian male is history of hypertension, peripheral edema, neuropathy, and recently diagnosed with Parkinson's disease 6 months ago at [facility] who presents with the above complaints.  Patient was not the most reliable historian, son at bedside provided some of the history.  Patient reported that he fell yesterday and was on the floor for a few hours, son reported that he may have fallen at 2-3 a.m. on 06/28/2013.  Patient eventually crawled to his cell phone and called his son who found the patient at 4 p.m. on 06/28/2013.  He decided to have the patient come to the emergency department for further evaluation.  Son also noted that over the last few weeks patient has had increased speech difficulty with slurring of his speech.  Son also noted right-sided weakness.  Patient denies losing consciousness with a fall.  Hospitalist service was asked to admit the patient for further care and management.  Patient denies any recent fevers but noted feeling warm.  Denies any chest pain or shortness of breath.  Denies any abdominal pain or diarrhea.  Denies any headaches or vision changes.  Review of Systems: All systems reviewed with the patient and positive as per history of present illness, otherwise all other systems are negative.  Past Medical History  Diagnosis Date  . Hypertension   . Parkinson disease   . Neuropathy   . Edema    History reviewed. No pertinent past surgical history. Family History  Problem Relation Age of Onset  . Heart attack Mother   . Heart attack Father    History   Social History  . Marital Status: Divorced    Spouse Name: N/A    Number of Children: N/A  . Years of Education: N/A   Occupational History  . Not on file.   Social History Main Topics  . Smoking status: Never Smoker   . Smokeless  tobacco: Not on file  . Alcohol Use: No  . Drug Use: No  . Sexual Activity: Not on file   Other Topics Concern  . Not on file   Social History Narrative  . No narrative on file   Allergies: Penicillins  Home Meds: Prior to Admission medications   Medication Sig Start Date End Date Taking? Authorizing Provider  acetaminophen (TYLENOL) 325 MG tablet Take 650 mg by mouth every 6 (six) hours as needed.   Yes Historical Provider, MD  amantadine (SYMMETREL) 100 MG capsule Take 100 mg by mouth 2 (two) times daily.   Yes Historical Provider, MD  furosemide (LASIX) 20 MG tablet Take 20 mg by mouth.   Yes Historical Provider, MD  gabapentin (NEURONTIN) 300 MG capsule Take 300 mg by mouth 3 (three) times daily.   Yes Historical Provider, MD  potassium chloride SA (K-DUR,KLOR-CON) 20 MEQ tablet Take 20 mEq by mouth 2 (two) times daily.   Yes Historical Provider, MD  traMADol (ULTRAM) 50 MG tablet Take by mouth every 6 (six) hours as needed.   Yes Historical Provider, MD    Physical Exam: Blood pressure 109/71, pulse 80, temperature 99.7 F (37.6 C), temperature source Oral, resp. rate 20, height 6\' 1"  (1.854 m), weight 95.255 kg (210 lb), SpO2 95.00%. General: Awake, Oriented x3, No acute distress, speech slightly slurred. HEENT: EOMI, Moist mucous membranes Neck: Supple CV: S1 and S2 Lungs: Clear to ascultation bilaterally Abdomen: Soft,  Nontender, Nondistended, +bowel sounds. Ext: Good pulses.  1+ edema. No clubbing or cyanosis noted. Neuro: Cranial Nerves II-XII grossly intact. Has 5/5 motor strength in left upper extremity. 4/5 motor strength in right upper extremity and bilateral lower extremities.  Lab results:  Recent Labs  06/29/13 0058  NA 141  K 4.3  CL 102  CO2 25  GLUCOSE 119*  BUN 38*  CREATININE 1.56*  CALCIUM 9.1    Recent Labs  06/29/13 0058  AST 60*  ALT 17  ALKPHOS 51  BILITOT 1.9*  PROT 7.1  ALBUMIN 3.6   No results found for this basename: LIPASE,  AMYLASE,  in the last 72 hours  Recent Labs  06/29/13 0058  WBC 12.0*  NEUTROABS 11.2*  HGB 12.3*  HCT 35.6*  MCV 91.5  PLT 126*    Recent Labs  06/29/13 0058  CKTOTAL 3665*   No components found with this basename: POCBNP,  No results found for this basename: DDIMER,  in the last 72 hours No results found for this basename: HGBA1C,  in the last 72 hours No results found for this basename: CHOL, HDL, LDLCALC, TRIG, CHOLHDL, LDLDIRECT,  in the last 72 hours No results found for this basename: TSH, T4TOTAL, FREET3, T3FREE, THYROIDAB,  in the last 72 hours No results found for this basename: VITAMINB12, FOLATE, FERRITIN, TIBC, IRON, RETICCTPCT,  in the last 72 hours Imaging results:  Ct Head Wo Contrast  06/29/2013   CLINICAL DATA:  Fall.  Weakness.  EXAM: CT HEAD WITHOUT CONTRAST  TECHNIQUE: Contiguous axial images were obtained from the base of the skull through the vertex without intravenous contrast.  COMPARISON:  None.  FINDINGS: Skull and Sinuses:Presumed mucous retention cyst in the anterior left maxillary antrum. Previous biparietal bur holes, often for subdural drainage.  Orbits: Bilateral cataract resection.  Brain: No evidence of acute abnormality, such as acute infarction, hemorrhage, hydrocephalus, or mass lesion/mass effect. Cerebral volume loss which is age appropriate. 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  IMPRESSION: No evidence of acute intracranial disease.   Electronically Signed   By: Jorje Guild M.D.   On: 06/29/2013 02:46   Dg Epidurography  06/07/2013   CLINICAL DATA:  Lumbosacral spondylosis without myelopathy. Right greater than left leg radicular symptoms. Previous lumbar surgery at L4-5. Mild chronic compression deformity at L3.  EXAM: LUMBAR INTERLAMINAR EPIDURAL INJECTION  FLUOROSCOPY TIME:  20 seconds  PROCEDURE: Procedure: After a thorough discussion of risks and benefits of the procedure, written and verbal consent was  obtained. Specific risks included puncture of the thecal sac and dura as well as nontherapeutic injection with general risks of bleeding, infection, injury to nerves, blood vessels, and adjacent structures. Time out form was completed. Verbal consent was obtained by Dr. Jola Baptist. We discussed the moderate likelihood of moderate lasting relief/attainment of therapeutic goal. The overlying skin was cleansed with betadine soap and anesthetized with 1% lidocaine without epinephrine. An interlaminar approach was performed at L3-L4. 20 gauge needle was advanced using loss-of-resistance technique.  DIAGNOSTIC/THERAPUETIC EPIDURAL INJECTION: Injection of Omnipaque 180 shows a good epidural pattern with spread above and below the level of needle placement, primarily on the side of needle placement. No vascular or subarachnoid opacification was seen. 120 mg of Depo-Medrol mixed with 5 cc of 1% Lidocaine were instilled. The procedure was well-tolerated, and the patient was discharged thirty minutes following the injection in good condition.  IMPRESSION: Technically successful right lumbar interlaminar epidural injection at L3-4.  Electronically Signed   By: Rolla Flatten M.D.   On: 06/07/2013 13:07   Dg Chest Port 1 View  06/29/2013   CLINICAL DATA:  Weakness.  EXAM: PORTABLE CHEST - 1 VIEW  COMPARISON:  None.  FINDINGS: Borderline cardiomegaly. Tortuous thoracic aorta. Crease markings at the bases, without definite consolidation. No effusion or pneumothorax.  IMPRESSION: Mild basilar atelectasis.   Electronically Signed   By: Jorje Guild M.D.   On: 06/29/2013 00:58   Other results: EKG: Normal sinus rhythm with heart rate of 73.  Assessment & Plan by Problem: Right-sided weakness with generalized weakness Etiology unclear.  Head CT showed no acute intracranial abnormality.  As patient has some right-sided weakness, and slurred speech will initiate TIA workup with MRI of the brain, 2-D echocardiogram, and carotid  Dopplers.  Request PT and OT evaluation in the morning.  Started the patient on aspirin.  Check hemoglobin A1c and lipid panel.  May need rehabilitation at skilled nursing facility after PT evaluation.  Acute renal failure No prior renal function for comparison.  Hold patient's furosemide.  Gently hydrate the patient on IV fluids and reassess renal function and patient's volume status.  If renal function does not improve, consider further workup at that time.  Rhabdomyolysis Likely due to fall.  Gentle hydration as indicated above.  Continue to monitor CK.  Parkinson's disease Management as per his primary care physician at [facility].  Patient has not been taking his Amantadine due to side effects, continue to hold.  Hypertension Stable.  Hold furosemide due to acute renal failure.  Monitor.  Edema Hold furosemide.  Monitor volume status daily.  Leukocytosis Likely reactive.  Chest x-ray negative. Urinalysis negative for nitrates and leukocytes, however has squamous epithelial cells and bacteria may be dirty urine.  Monitor urine culture.    Anemia May be due to chronic disease.  Check anemia panel in the morning.  Thrombocytopenia No prior labs for comparison.  Continue to monitor.  Mildly elevated liver function tests Continue to monitor.  Further workup as per his primary care physician.  Fever Etiology unclear. Infectious workup done as indicated above. Send blood cultures x2 and influenza PCR. If any hemodynamic instability or source of fever then start antibiotics.  Prophylaxis  Lovenox.  CODE STATUS Full code.  This was discussed with the patient the time of admission.  Disposition Admit the patient to telemetry as inpatient.  Time spent on admission, talking to the patient, and coordinating care was: 60 mins.  Deckard Stuber A, MD 06/29/2013, 4:02 AM

## 2013-06-29 NOTE — Progress Notes (Signed)
EEG completed; results pending.    

## 2013-06-30 ENCOUNTER — Inpatient Hospital Stay (HOSPITAL_COMMUNITY): Payer: Medicare HMO

## 2013-06-30 LAB — CK TOTAL AND CKMB (NOT AT ARMC)
CK, MB: 5.8 ng/mL — AB (ref 0.3–4.0)
RELATIVE INDEX: 0.2 (ref 0.0–2.5)
Total CK: 3319 U/L — ABNORMAL HIGH (ref 7–232)

## 2013-06-30 LAB — BASIC METABOLIC PANEL
BUN: 29 mg/dL — AB (ref 6–23)
CALCIUM: 8.3 mg/dL — AB (ref 8.4–10.5)
CO2: 20 mEq/L (ref 19–32)
CREATININE: 1.06 mg/dL (ref 0.50–1.35)
Chloride: 107 mEq/L (ref 96–112)
GFR, EST AFRICAN AMERICAN: 72 mL/min — AB (ref >90)
GFR, EST NON AFRICAN AMERICAN: 62 mL/min — AB (ref >90)
Glucose, Bld: 89 mg/dL (ref 70–99)
POTASSIUM: 3.9 meq/L (ref 3.7–5.3)
Sodium: 140 mEq/L (ref 137–147)

## 2013-06-30 LAB — URINE CULTURE: Colony Count: 9000

## 2013-06-30 LAB — GLUCOSE, CAPILLARY
Glucose-Capillary: 78 mg/dL (ref 70–99)
Glucose-Capillary: 83 mg/dL (ref 70–99)

## 2013-06-30 LAB — CK: Total CK: 3212 U/L — ABNORMAL HIGH (ref 7–232)

## 2013-06-30 LAB — HOMOCYSTEINE: HOMOCYSTEINE-NORM: 10.6 umol/L (ref 4.0–15.4)

## 2013-06-30 LAB — RPR: RPR Ser Ql: NONREACTIVE

## 2013-06-30 MED ORDER — CYANOCOBALAMIN 1000 MCG/ML IJ SOLN
1000.0000 ug | INTRAMUSCULAR | Status: AC
  Administered 2013-06-30: 1000 ug via INTRAMUSCULAR
  Filled 2013-06-30: qty 1

## 2013-06-30 MED ORDER — CARBIDOPA-LEVODOPA 25-100 MG PO TABS
1.0000 | ORAL_TABLET | Freq: Two times a day (BID) | ORAL | Status: DC
  Administered 2013-07-01: 1 via ORAL
  Filled 2013-06-30: qty 1

## 2013-06-30 MED ORDER — CYANOCOBALAMIN 1000 MCG/ML IJ SOLN
1000.0000 ug | Freq: Once | INTRAMUSCULAR | Status: AC
  Administered 2013-06-30: 1000 ug via INTRAMUSCULAR
  Filled 2013-06-30: qty 1

## 2013-06-30 MED ORDER — CARBIDOPA-LEVODOPA 25-100 MG PO TABS
1.0000 | ORAL_TABLET | Freq: Three times a day (TID) | ORAL | Status: DC
  Administered 2013-06-30 (×2): 1 via ORAL
  Filled 2013-06-30 (×2): qty 1

## 2013-06-30 NOTE — Clinical Social Work Note (Signed)
Per Marianna Fuss at [facility] has authorized SNF rehab at [facility], patient informed and agreeable. Anticipate discharge tomorrow.  Edwyna Shell, LCSW Clinical Social Worker [phone])

## 2013-06-30 NOTE — Progress Notes (Signed)
Patient ID: Julian Ramirez, male   DOB: 04-Feb-1927, 80 y.o.   MRN: [id number]  [place] A. Merlene Laughter, MD     [email or web address]          Julian Ramirez is an 80 y.o. male.   Assessment/Plan: Multifactorial subacute gait impairment. Causes includes Parkinson disease, vitamin B12 deficiency causing myelopathy and neuropathy, osteoarthritis, degenerative disc disease and osteophytic changes of the lumbar spine and generalized aging effect. The patient also has an acute UTI which could also contribute to the acute worsening. However, he clearly has had a more subacute chronic deterioration in gait. The patient has improved with the current treatment. The patient will be maintained on Sinemet twice a day dosing for a week and then increase to 3 times a day dosing. He is complaining of some generalized pain especially along the low back region. The gabapentin seems to be helpful and therefore the dose could increase to 3 times a day dosing if tolerated. When necessary doses of Ultram is also on board. Physical therapy and rehabilitation is currently ordered and seemed to be beneficial. He is going to be discharged to skilled nursing facility for further rehabilitation and therapy.  Patient reports that he has improved significantly overnight with the current treatments. Indeed, he appears to have increased in motion. He is less bradykinetic and his speech improved. He really has more mobility. He has less evidence of mask faces. Eye blink rate is improved. No tremors are noted today. He has significantly improved dexterity and finger movements of the hands especially on the right side. His leg strength is also improved markedly with the grade not being 4+/5 on hip flexion bilaterally. Dorsiflexion also 4+.  The patient's cervical spine MRI is reviewed in person. He does have multilevel disc osteophyte complex which effaces the ventral surface of the thecal sac. However, the patient has  plenty of CSF space posteriorly and there is no evidence of compression of the cord itself.     Objective: Vital signs in last 24 hours: Temp:  [98 F (36.7 C)-98.4 F (36.9 C)] 98 F (36.7 C) (01/09 1744) Pulse Rate:  [67-74] 67 (01/09 1744) Resp:  [17-26] 20 (01/09 1744) BP: (102-128)/(47-82) 113/68 mmHg (01/09 1744) SpO2:  [95 %-99 %] 99 % (01/09 1744)  Intake/Output from previous day: 01/08 0701 - 01/09 0700 In: 2607.5 [P.O.:240; I.V.:2167.5; IV Piggyback:200] Out: 950 [Urine:950] Intake/Output this shift:   Nutritional status: Dysphagia   Lab Results: Results for orders placed during the hospital encounter of 06/28/13 (from the past 48 hour(s))  CBC WITH DIFFERENTIAL     Status: Abnormal   Collection Time    06/29/13 12:58 AM      Result Value Range   WBC 12.0 (*) 4.0 - 10.5 K/uL   RBC 3.89 (*) 4.22 - 5.81 MIL/uL   Hemoglobin 12.3 (*) 13.0 - 17.0 g/dL   HCT 35.6 (*) 39.0 - 52.0 %   MCV 91.5  78.0 - 100.0 fL   MCH 31.6  26.0 - 34.0 pg   MCHC 34.6  30.0 - 36.0 g/dL   RDW 13.2  11.5 - 15.5 %   Platelets 126 (*) 150 - 400 K/uL   Neutrophils Relative % 94 (*) 43 - 77 %   Neutro Abs 11.2 (*) 1.7 - 7.7 K/uL   Lymphocytes Relative 3 (*) 12 - 46 %   Lymphs Abs 0.3 (*) 0.7 - 4.0 K/uL   Monocytes Relative 3  3 - 12 %   Monocytes  Absolute 0.4  0.1 - 1.0 K/uL   Eosinophils Relative 0  0 - 5 %   Eosinophils Absolute 0.0  0.0 - 0.7 K/uL   Basophils Relative 0  0 - 1 %   Basophils Absolute 0.0  0.0 - 0.1 K/uL  COMPREHENSIVE METABOLIC PANEL     Status: Abnormal   Collection Time    06/29/13 12:58 AM      Result Value Range   Sodium 141  137 - 147 mEq/L   Potassium 4.3  3.7 - 5.3 mEq/L   Chloride 102  96 - 112 mEq/L   CO2 25  19 - 32 mEq/L   Glucose, Bld 119 (*) 70 - 99 mg/dL   BUN 38 (*) 6 - 23 mg/dL   Creatinine, Ser 1.56 (*) 0.50 - 1.35 mg/dL   Calcium 9.1  8.4 - 10.5 mg/dL   Total Protein 7.1  6.0 - 8.3 g/dL   Albumin 3.6  3.5 - 5.2 g/dL   AST 60 (*) 0 - 37 U/L     ALT 17  0 - 53 U/L   Alkaline Phosphatase 51  39 - 117 U/L   Total Bilirubin 1.9 (*) 0.3 - 1.2 mg/dL   GFR calc non Af Amer 39 (*) >90 mL/min   GFR calc Af Amer 45 (*) >90 mL/min   Comment: (NOTE)     The eGFR has been calculated using the CKD EPI equation.     This calculation has not been validated in all clinical situations.     eGFR's persistently <90 mL/min signify possible Chronic Kidney     Disease.  LACTIC ACID, PLASMA     Status: Abnormal   Collection Time    06/29/13 12:58 AM      Result Value Range   Lactic Acid, Venous 2.7 (*) 0.5 - 2.2 mmol/L  CK     Status: Abnormal   Collection Time    06/29/13 12:58 AM      Result Value Range   Total CK 3665 (*) 7 - 232 U/L  URINALYSIS, ROUTINE W REFLEX MICROSCOPIC     Status: Abnormal   Collection Time    06/29/13  2:30 AM      Result Value Range   Color, Urine YELLOW  YELLOW   APPearance CLEAR  CLEAR   Specific Gravity, Urine 1.025  1.005 - 1.030   pH 6.0  5.0 - 8.0   Glucose, UA NEGATIVE  NEGATIVE mg/dL   Hgb urine dipstick LARGE (*) NEGATIVE   Bilirubin Urine NEGATIVE  NEGATIVE   Ketones, ur NEGATIVE  NEGATIVE mg/dL   Protein, ur 100 (*) NEGATIVE mg/dL   Urobilinogen, UA 0.2  0.0 - 1.0 mg/dL   Nitrite NEGATIVE  NEGATIVE   Leukocytes, UA NEGATIVE  NEGATIVE  URINE MICROSCOPIC-ADD ON     Status: Abnormal   Collection Time    06/29/13  2:30 AM      Result Value Range   Squamous Epithelial / LPF MANY (*) RARE   RBC / HPF 0-2  <3 RBC/hpf   Bacteria, UA MANY (*) RARE   Urine-Other MUCOUS PRESENT    URINE CULTURE     Status: None   Collection Time    06/29/13  2:30 AM      Result Value Range   Specimen Description URINE, CLEAN CATCH     Special Requests NONE     Culture  Setup Time       Value: 06/29/2013 14:47  Performed at [facility]       Value: 9,000 COLONIES/ML     Performed at [facility]   Culture       Value: INSIGNIFICANT GROWTH     Performed at [facility]    Report Status 06/30/2013 FINAL    MRSA PCR SCREENING     Status: None   Collection Time    06/29/13  4:39 AM      Result Value Range   MRSA by PCR NEGATIVE  NEGATIVE   Comment:            The GeneXpert MRSA Assay (FDA     approved for NASAL specimens     only), is one component of a     comprehensive MRSA colonization     surveillance program. It is not     intended to diagnose MRSA     infection nor to guide or     monitor treatment for     MRSA infections.  INFLUENZA PANEL BY PCR (TYPE A & B, H1N1)     Status: None   Collection Time    06/29/13  5:21 AM      Result Value Range   Influenza A By PCR NEGATIVE  NEGATIVE   Influenza B By PCR NEGATIVE  NEGATIVE   H1N1 flu by pcr NOT DETECTED  NOT DETECTED   Comment:            The Xpert Flu assay (FDA approved for     nasal aspirates or washes and     nasopharyngeal swab specimens), is     intended as an aid in the diagnosis of     influenza and should not be used as     a sole basis for treatment.  HEMOGLOBIN A1C     Status: None   Collection Time    06/29/13  6:18 AM      Result Value Range   Hemoglobin A1C 5.4  <5.7 %   Comment: (NOTE)                                                                               According to the ADA Clinical Practice Recommendations for 2011, when     HbA1c is used as a screening test:      >=6.5%   Diagnostic of Diabetes Mellitus               (if abnormal result is confirmed)     5.7-6.4%   Increased risk of developing Diabetes Mellitus     References:Diagnosis and Classification of Diabetes Mellitus,Diabetes     EMLJ,4492,01(EOFHQ 1):S62-S69 and Standards of Medical Care in             Diabetes - 2011,Diabetes Care,2011,34 (Suppl 1):S11-S61.   Mean Plasma Glucose 108  <117 mg/dL   Comment: Performed at [facility]     Status: None   Collection Time    06/29/13  6:18 AM      Result Value Range   Cholesterol 141  0 - 200 mg/dL   Triglycerides 113  <150  mg/dL   HDL 54  >  39 mg/dL   Total CHOL/HDL Ratio 2.6     VLDL 23  0 - 40 mg/dL   LDL Cholesterol 64  0 - 99 mg/dL   Comment:            Total Cholesterol/HDL:CHD Risk     Coronary Heart Disease Risk Table                         Men   Women      1/2 Average Risk   3.4   3.3      Average Risk       5.0   4.4      2 X Average Risk   9.6   7.1      3 X Average Risk  23.4   11.0                Use the calculated Patient Ratio     above and the CHD Risk Table     to determine the patient's CHD Risk.                ATP III CLASSIFICATION (LDL):      <100     mg/dL   Optimal      100-129  mg/dL   Near or Above                        Optimal      130-159  mg/dL   Borderline      160-189  mg/dL   High      >190     mg/dL   Very High  CBC     Status: Abnormal   Collection Time    06/29/13  6:18 AM      Result Value Range   WBC 10.1  4.0 - 10.5 K/uL   RBC 3.44 (*) 4.22 - 5.81 MIL/uL   Hemoglobin 10.8 (*) 13.0 - 17.0 g/dL   HCT 31.2 (*) 39.0 - 52.0 %   MCV 90.7  78.0 - 100.0 fL   MCH 31.4  26.0 - 34.0 pg   MCHC 34.6  30.0 - 36.0 g/dL   RDW 13.2  11.5 - 15.5 %   Platelets PLATELET COUNT CONFIRMED BY SMEAR  150 - 400 K/uL   Comment: PLATELETS APPEAR DECREASED  BASIC METABOLIC PANEL     Status: Abnormal   Collection Time    06/29/13  6:18 AM      Result Value Range   Sodium 140  137 - 147 mEq/L   Potassium 3.7  3.7 - 5.3 mEq/L   Chloride 104  96 - 112 mEq/L   CO2 20  19 - 32 mEq/L   Glucose, Bld 114 (*) 70 - 99 mg/dL   BUN 37 (*) 6 - 23 mg/dL   Creatinine, Ser 1.56 (*) 0.50 - 1.35 mg/dL   Calcium 8.5  8.4 - 10.5 mg/dL   GFR calc non Af Amer 39 (*) >90 mL/min   GFR calc Af Amer 45 (*) >90 mL/min   Comment: (NOTE)     The eGFR has been calculated using the CKD EPI equation.     This calculation has not been validated in all clinical situations.     eGFR's persistently <90 mL/min signify possible Chronic Kidney     Disease.  CK     Status: Abnormal   Collection Time     06/29/13  6:18  AM      Result Value Range   Total CK 4830 (*) 7 - 232 U/L  VITAMIN B12     Status: Abnormal   Collection Time    06/29/13  6:18 AM      Result Value Range   Vitamin B-12 188 (*) 211 - 911 pg/mL   Comment: Performed at [facility]     Status: None   Collection Time    06/29/13  6:18 AM      Result Value Range   Folate 17.8     Comment: (NOTE)     Reference Ranges            Deficient:       0.4 - 3.3 ng/mL            Indeterminate:   3.4 - 5.4 ng/mL            Normal:              > 5.4 ng/mL     Performed at [facility] TIBC     Status: Abnormal   Collection Time    06/29/13  6:18 AM      Result Value Range   Iron <10 (*) 42 - 135 ug/dL   TIBC Not calculated due to Iron <10.  215 - 435 ug/dL   Saturation Ratios Not calculated due to Iron <10.  20 - 55 %   UIBC 227  125 - 400 ug/dL   Comment: Performed at [facility]     Status: Abnormal   Collection Time    06/29/13  6:18 AM      Result Value Range   Ferritin 351 (*) 22 - 322 ng/mL   Comment: Performed at [facility]     Status: Abnormal   Collection Time    06/29/13  6:18 AM      Result Value Range   Retic Ct Pct 1.4  0.4 - 3.1 %   RBC. 3.44 (*) 4.22 - 5.81 MIL/uL   Retic Count, Manual 48.2  19.0 - 186.0 K/uL  CULTURE, BLOOD (ROUTINE X 2)     Status: None   Collection Time    06/29/13  6:18 AM      Result Value Range   Specimen Description BLOOD LEFT HAND     Special Requests BOTTLES DRAWN AEROBIC AND ANAEROBIC 10CC     Culture  Setup Time       Value: 06/30/2013 14:12     Performed at [facility]   Culture       Value: [place]     Note: Gram Stain Report Called to,Read Back By and Verified With: MCDANIEL M 2225 06/29/13 BY FORSYTH K     Performed at [facility]   Report Status PENDING    CULTURE, BLOOD (ROUTINE X 2)     Status: None   Collection Time    06/29/13  6:18 AM      Result  Value Range   Specimen Description BLOOD RIGHT ARM     Special Requests BOTTLES DRAWN AEROBIC AND ANAEROBIC 8 CC EACH     Culture  Setup Time       Value: 06/30/2013 14:14     Performed at [facility]   Culture       Value: GRAM NEGATIVE RODS     Note: Gram Stain Report Called to,Read  Back By and Verified With: MCDANIEL M 2225 06/29/13 BY FORSYTH K     Performed at [facility]   Report Status PENDING    GLUCOSE, CAPILLARY     Status: Abnormal   Collection Time    06/29/13  7:17 AM      Result Value Range   Glucose-Capillary 117 (*) 70 - 99 mg/dL  GLUCOSE, CAPILLARY     Status: None   Collection Time    06/29/13  9:57 PM      Result Value Range   Glucose-Capillary 78  70 - 99 mg/dL   Comment 1 Documented in Chart     Comment 2 Notify RN    RPR     Status: None   Collection Time    06/30/13 12:04 AM      Result Value Range   RPR NON REACTIVE  NON REACTIVE   Comment: Performed at [facility]     Status: None   Collection Time    06/30/13 12:04 AM      Result Value Range   Homocysteine 10.6  4.0 - 15.4 umol/L   Comment: Performed at [facility]  CK     Status: Abnormal   Collection Time    06/30/13  5:00 AM      Result Value Range   Total CK 3212 (*) 7 - 232 U/L  BASIC METABOLIC PANEL     Status: Abnormal   Collection Time    06/30/13  5:01 AM      Result Value Range   Sodium 140  137 - 147 mEq/L   Potassium 3.9  3.7 - 5.3 mEq/L   Chloride 107  96 - 112 mEq/L   CO2 20  19 - 32 mEq/L   Glucose, Bld 89  70 - 99 mg/dL   BUN 29 (*) 6 - 23 mg/dL   Creatinine, Ser 1.06  0.50 - 1.35 mg/dL   Calcium 8.3 (*) 8.4 - 10.5 mg/dL   GFR calc non Af Amer 62 (*) >90 mL/min   GFR calc Af Amer 72 (*) >90 mL/min   Comment: (NOTE)     The eGFR has been calculated using the CKD EPI equation.     This calculation has not been validated in all clinical situations.     eGFR's persistently <90 mL/min signify possible Chronic Kidney      Disease.  CK TOTAL AND CKMB     Status: Abnormal   Collection Time    06/30/13  6:47 AM      Result Value Range   Total CK 3319 (*) 7 - 232 U/L   CK, MB 5.8 (*) 0.3 - 4.0 ng/mL   Relative Index 0.2  0.0 - 2.5   Comment: Performed at [place], [place]     Status: None   Collection Time    06/30/13  4:43 PM      Result Value Range   Glucose-Capillary 83  70 - 99 mg/dL   Comment 1 Notify RN     Comment 2 Documented in Chart      Lipid Panel  Recent Labs  06/29/13 0618  CHOL 141  TRIG 113  HDL 54  CHOLHDL 2.6  VLDL 23  LDLCALC 64    Studies/Results: Ct Head Wo Contrast  06/29/2013   CLINICAL DATA:  Fall.  Weakness.  EXAM: CT HEAD WITHOUT CONTRAST  TECHNIQUE: Contiguous axial images were obtained from the base of  the skull through the vertex without intravenous contrast.  COMPARISON:  None.  FINDINGS: Skull and Sinuses:Presumed mucous retention cyst in the anterior left maxillary antrum. Previous biparietal bur holes, often for subdural drainage.  Orbits: Bilateral cataract resection.  Brain: No evidence of acute abnormality, such as acute infarction, hemorrhage, hydrocephalus, or mass lesion/mass effect. Cerebral volume loss which is age appropriate. 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  IMPRESSION: No evidence of acute intracranial disease.   Electronically Signed   By: Jorje Guild M.D.   On: 06/29/2013 02:46   Mr Jodene Nam Head Wo Contrast  06/29/2013   CLINICAL DATA:  Right-sided weakness, confusion, slurred speech, and recent fall.  EXAM: MRI HEAD WITHOUT CONTRAST  MRA HEAD WITHOUT CONTRAST  TECHNIQUE: Multiplanar, multiecho pulse sequences of the brain and surrounding structures were obtained without intravenous contrast. Angiographic images of the head were obtained using MRA technique without contrast.  COMPARISON:  Head CT 06/29/2013  FINDINGS: MRI HEAD FINDINGS  There is no evidence of acute infarct. Scattered foci of T2  hyperintensity within the subcortical and deep cerebral white matter and pons are nonspecific but compatible with minimal chronic small vessel ischemic disease. There is moderate generalized cerebral atrophy. Subcentimeter left choroidal fissure cyst is incidentally noted. There is no evidence of intracranial hemorrhage, midline shift, or extra-axial fluid collection. Major intracranial vascular flow voids are unremarkable. Prior bilateral cataract surgery is noted. Mild bilateral ethmoid air cell mucosal thickening is noted. Left maxillary sinus polyp is noted.  MRA HEAD FINDINGS  Distal vertebral arteries are patent and codominant. PICA origins are patent bilaterally. Basilar artery is patent without stenosis. SCAs are patent. PCAs are unremarkable. Posterior communicating arteries are not identified. Internal carotid arteries are patent from skullbase to carotid terminus. ACA and MCA origins are patent. Left A1 segment is hypoplastic. Visualized ACA and MCA branches are otherwise unremarkable. No intracranial aneurysm is identified.  IMPRESSION: 1. No evidence of acute infarct or other acute intracranial abnormality. 2. Moderate cerebral atrophy and minimal chronic small vessel ischemic disease. 3. No evidence of major intracranial arterial occlusion or flow-limiting stenosis.   Electronically Signed   By: Logan Bores   On: 06/29/2013 18:42   Mri Brain Without Contrast  06/29/2013   CLINICAL DATA:  Right-sided weakness, confusion, slurred speech, and recent fall.  EXAM: MRI HEAD WITHOUT CONTRAST  MRA HEAD WITHOUT CONTRAST  TECHNIQUE: Multiplanar, multiecho pulse sequences of the brain and surrounding structures were obtained without intravenous contrast. Angiographic images of the head were obtained using MRA technique without contrast.  COMPARISON:  Head CT 06/29/2013  FINDINGS: MRI HEAD FINDINGS  There is no evidence of acute infarct. Scattered foci of T2 hyperintensity within the subcortical and deep  cerebral white matter and pons are nonspecific but compatible with minimal chronic small vessel ischemic disease. There is moderate generalized cerebral atrophy. Subcentimeter left choroidal fissure cyst is incidentally noted. There is no evidence of intracranial hemorrhage, midline shift, or extra-axial fluid collection. Major intracranial vascular flow voids are unremarkable. Prior bilateral cataract surgery is noted. Mild bilateral ethmoid air cell mucosal thickening is noted. Left maxillary sinus polyp is noted.  MRA HEAD FINDINGS  Distal vertebral arteries are patent and codominant. PICA origins are patent bilaterally. Basilar artery is patent without stenosis. SCAs are patent. PCAs are unremarkable. Posterior communicating arteries are not identified. Internal carotid arteries are patent from skullbase to carotid terminus. ACA and MCA origins are patent. Left A1 segment is hypoplastic. Visualized ACA and  MCA branches are otherwise unremarkable. No intracranial aneurysm is identified.  IMPRESSION: 1. No evidence of acute infarct or other acute intracranial abnormality. 2. Moderate cerebral atrophy and minimal chronic small vessel ischemic disease. 3. No evidence of major intracranial arterial occlusion or flow-limiting stenosis.   Electronically Signed   By: Logan Bores   On: 06/29/2013 18:42   Mr Cervical Spine Wo Contrast  06/30/2013   CLINICAL DATA:  Right-sided weakness.  Recent fall.  EXAM: MRI CERVICAL SPINE WITHOUT CONTRAST  TECHNIQUE: Multiplanar, multisequence MR imaging was performed. No intravenous contrast was administered.  COMPARISON:  None.  FINDINGS: Reversal of the normal cervical lordosis. The apex of the reversal is at C5-C6. Cervical cord shows no intra medullary lesions or edema. Bone marrow signal shows degenerative changes throughout the cervical spine. No destructive osseous lesions. There are flow voids present in both vertebral arteries.  C2-C3:  Disc desiccation.  No stenosis.   C3-C4: Disc degeneration with right eccentric posterior disc osteophyte complex. Mild central stenosis. Disc osteophyte complex contacts and flattens the right ventral cord. Posterior ligamentum flavum redundancy is also present. Left-greater-than-right foraminal stenosis is present associated with facet arthrosis and uncovertebral spurring.  C4-C5: Disc degeneration. Possible ossification of the disc. Shallow broad-based disc osteophyte complex just contacting the ventral cervical cord with mild flattening. Mild left foraminal stenosis associated with uncovertebral spurring.  C5-C6: Left paracentral disc osteophyte complex just contacting the ventral cord with flattening. Central stenosis is mild. Left foraminal stenosis due to uncovertebral spurring. Mild facet degeneration.  C6-C7: Disc degeneration. Posterior ligamentum flavum redundancy. Left foraminal stenosis due to uncovertebral spurring and facet arthrosis. The right neural foramen is patent.  C7-T1: Left facet degeneration. 1 mm anterolisthesis of C7 on T1 associated with facet degeneration. The central canal and neural foramina appear adequately patent.  On inversion recovery imaging, there is no prevertebral or ligamentous edema to suggest an acute injury in this patient with a history of recent fall. No bone marrow edema to suggest fracture.  IMPRESSION: Moderate multilevel cervical spondylosis described above. No acute abnormality.   Electronically Signed   By: Dereck Ligas M.D.   On: 06/30/2013 12:52   US Carotid Duplex Bilateral  06/29/2013   CLINICAL DATA:  TIA, history hypertension, Parkinson's  EXAM: BILATERAL CAROTID DUPLEX ULTRASOUND  TECHNIQUE: Pearline Cables scale imaging, color Doppler and duplex ultrasound were performed of bilateral carotid and vertebral arteries in the neck.  COMPARISON:  None  FINDINGS: Criteria: Quantification of carotid stenosis is based on velocity parameters that correlate the residual internal carotid diameter with  NASCET-based stenosis levels, using the diameter of the distal internal carotid lumen as the denominator for stenosis measurement. Examination limited by patient's snoring throughout study.  The following velocity measurements were obtained:  RIGHT  ICA:  108/20 cm/sec  CCA:  19/5 cm/sec  SYSTOLIC ICA/CCA RATIO:  0.93  DIASTOLIC ICA/CCA RATIO:  2.67  ECA:  304 cm/sec  LEFT  ICA:  136/15 cm/sec  CCA:  12/45 cm/sec  SYSTOLIC ICA/CCA RATIO:  8.09  DIASTOLIC ICA/CCA RATIO:  9.83  ECA:  299 cm/sec  RIGHT CAROTID ARTERY: Calcified and noncalcified atherosclerotic plaque formation identified in the distal right CCA and right carotid bulb extending into the proximal right ECA and ICA. Significantly turbulent blood flow identified in proximal right ECA with high velocity jet. Turbulent blood flow also identified within proximal right ICA on waveform analysis though color Doppler imaging demonstrates fairly laminar flow. Patient arhythmic during imaging.  RIGHT VERTEBRAL ARTERY:  Patent, antegrade  LEFT CAROTID ARTERY: Calcified and noncalcified plaque at distal left CCA extending into left ECA and left ICA. Associated turbulent blood flow on color Doppler imaging in the left ECA. Spectral broadening left ECA on waveform analysis. Fairly laminar flow on color Doppler imaging in the left ICA though mild spectral broadening is seen in the left ICA on waveform analysis. High velocity jet present in the left ECA proximally.  LEFT VERTEBRAL ARTERY:  Patent, antegrade  IMPRESSION: Plaque formation at the distal common carotid arteries, carotid bulbs and extending into the proximal internal and external carotid arteries bilaterally.  High velocity jets with turbulence or noted in the proximal external carotid arteries bilaterally suggesting stenosis.  Velocities in the right ICA correspond to a less than 50% diameter stenosis.  Velocities in the left ICA correspond to a 50-69% diameter stenosis.  Intermittent arrhythmia.    Electronically Signed   By: Lavonia Dana M.D.   On: 06/29/2013 10:08   Dg Chest Port 1 View  06/29/2013   CLINICAL DATA:  Weakness.  EXAM: PORTABLE CHEST - 1 VIEW  COMPARISON:  None.  FINDINGS: Borderline cardiomegaly. Tortuous thoracic aorta. Crease markings at the bases, without definite consolidation. No effusion or pneumothorax.  IMPRESSION: Mild basilar atelectasis.   Electronically Signed   By: Jorje Guild M.D.   On: 06/29/2013 00:58    Medications:  Scheduled Meds: . aspirin  325 mg Oral Daily  . carbidopa-levodopa  1 tablet Oral TID  . ciprofloxacin  400 mg Intravenous Q12H  . enoxaparin (LOVENOX) injection  40 mg Subcutaneous Q24H  . gabapentin  300 mg Oral BID   Continuous Infusions:  PRN Meds:.acetaminophen, traMADol     LOS: 2 days   Kofi A. Merlene Laughter, M.D.  Diplomate, Tax adviser of Psychiatry and Neurology ( Neurology).

## 2013-06-30 NOTE — Clinical Social Work Note (Signed)
Patient and son given bed offer, chose [facility].  Son asked to go to SNF to sign preadmission paperwork, MD informed that patient must discharge prior to noon on Saturday.  FL2 on shadow chart. [employer] SNF authorization contacted, informed of bed choice, clinicals faxed for review, auth requested.  [facility] also in contact w [employer] for [place] [facility].  Edwyna Shell, LCSW Clinical Social Worker [phone])

## 2013-06-30 NOTE — Progress Notes (Addendum)
Notified by lab of gram negative rods present in blood culture. MD made aware; no orders received at this time

## 2013-06-30 NOTE — Clinical Social Work Placement (Signed)
    Clinical Social Work Department CLINICAL SOCIAL WORK PLACEMENT NOTE 06/30/2013  Patient:  [name], Julian Ramirez  Account Number:  [id number] Admit date:  06/28/2013  Clinical Social Worker:  Edwyna Shell, CLINICAL SOCIAL WORKER  Date/time:  06/30/2013 09:30 AM  Clinical Social Work is seeking post-discharge placement for this patient at the following level of care:   [place]   (*CSW will update this form in Epic as items are completed)   06/30/2013  Patient/family provided with [place] Department of Clinical Social Work's list of facilities offering this level of care within the geographic area requested by the patient (or if unable, by the patient's family).  06/30/2013  Patient/family informed of their freedom to choose among providers that offer the needed level of care, that participate in Medicare, Medicaid or managed care program needed by the patient, have an available bed and are willing to accept the patient.  06/30/2013  Patient/family informed of [facility]' ownership interest in [facility], as well as of the fact that they are under no obligation to receive care at this facility.  PASARR submitted to EDS on 06/30/2013 PASARR number received from EDS on 06/30/2013  FL2 transmitted to all facilities in geographic area requested by pt/family on  06/30/2013 FL2 transmitted to all facilities within larger geographic area on   Patient informed that his/her managed care company has contracts with or will negotiate with  certain facilities, including the following:     Patient/family informed of bed offers received:  06/30/2013 Patient chooses bed at [facility] Physician recommends and patient chooses bed at    Patient to be transferred to  on   Patient to be transferred to facility by   The following physician request were entered in Epic:   Additional Comments:  Edwyna Shell, LCSW Clinical Social Worker [phone])

## 2013-06-30 NOTE — Progress Notes (Signed)
Report called and given to Julian Sias, RN. Patient alert, oriented and in stable condition at the time of transfer patient being transferred to dept 300. Patient's son is at bedside at the time of transport. Patient transported by RN and NT to dept 300

## 2013-06-30 NOTE — Procedures (Signed)
[place] A. Merlene Laughter, MD     [email or web address]        NAME:  Julian Ramirez, Julian Ramirez                   ACCOUNT NO.:  MEDICAL RECORD NO.:  [id number]  LOCATION:                                 FACILITY:  PHYSICIAN:  Ghazi Rumpf A. Merlene Laughter, M.D. DATE OF BIRTH:  1927/03/29  DATE OF PROCEDURE: DATE OF DISCHARGE:                             EEG INTERPRETATION   This is an 80 year old man who was found unresponsive on the floor.  He was confused.  The study is being done to evaluate for seizures.  MEDICATIONS:  Aspirin, Symmetrel, Lasix, Neurontin, Ultram, Cipro, Lovenox, and potassium.  ANALYSIS:  A 16-channel recording using standard 10/20 measurement is conducted for approximately 20 minutes.  There is a well-formed posterior dominant rhythm of 7.5 hertz which attenuates with eye opening.  There is beta activity observed in the frontal areas.  Awake and sleep activities are recorded.  Much of the recording observed during stage II sleep with K complexes and sleep spindles.  Photic stimulation and hyperventilation were not carried out.  There is no focal or lateralized slowing.  There is no epileptiform activity observed.  IMPRESSION:  Mild generalized slowing; however, there is no epileptiform activity observed.     Bazil Dhanani A. Merlene Laughter, M.D.     KAD/MEDQ  D:  06/29/2013  T:  06/29/2013  Job:  [id number]

## 2013-06-30 NOTE — Clinical Social Work Psychosocial (Signed)
Clinical Social Work Department BRIEF PSYCHOSOCIAL ASSESSMENT 06/30/2013  Patient:  Julian Ramirez, Julian Ramirez     Account Number:  [id number]     Admit date:  06/28/2013  Clinical Social Worker:  Edwyna Shell, CLINICAL SOCIAL WORKER  Date/Time:  06/30/2013 09:00 AM  Referred by:  Physician  Date Referred:  06/30/2013 Referred for  SNF Placement   Other Referral:   Interview type:  Patient Other interview type:   Also spoke w son at bedside w patient permission    PSYCHOSOCIAL DATA Living Status:  ALONE Admitted from facility:   Level of care:   Primary support name:  Minor Iden Primary support relationship to patient:  CHILD, ADULT Degree of support available:   Lives next door to adult son who provides help w errands and laundry PRN    CURRENT CONCERNS Current Concerns  Post-Acute Placement   Other Concerns:    SOCIAL WORK ASSESSMENT / PLAN CSW met w patient at bedsisde, patient alert and oriented x4.  Son in room, participating w patient permission. Patient lives in independent home next door to adult son. Has been able to care for himself, son assists w laundry and shopping.  Son says that patient has been able to transfer himself, walks sometimes w a cane, fixes his own meals and generally cares for himself.   However, son has seen that patient has been increasingly weak.  Patient says current fall "scared him" - patient says that fall was sudden and unexpected.  Patient is agreeable to SNF rehab. However, says he has been in rehab before and his brother "had to come and get me out."  Says facility was filthy, and personnel were "unskilled."    CSW explained placement process, including process of seeking available beds and obtaining insurance authorization.  Son and patient informed about [employer] copays.  Agreeable to initiation of bed search process, prefer facility in [place] or [place]  as son says he has difficulty driving long distances.   CSW has spoken w Oletta Darter, Salli [facility]ry SNF authorization, to alert her to need for SNF rehab bed.   Assessment/plan status:  Psychosocial Support/Ongoing Assessment of Needs Other assessment/ plan:   Information/referral to community resources:   SNF list    PATIENT'S/FAMILY'S RESPONSE TO PLAN OF CARE: Patient says "I will do whatever I need to get get back home."        Edwyna Shell, [place] Worker [phone])

## 2013-06-30 NOTE — Progress Notes (Signed)
Physical Therapy Treatment Patient Details Name: Julian Ramirez MRN: [id number] DOB: 1927-02-12 Today's Date: 06/30/2013 Time: 8938-1017 PT Time Calculation (min): 32 min  PT Assessment / Plan / Recommendation  History of Present Illness Pt is very alert and oriented today...anxious to try to get OOB and walk.   PT Comments   Pt is able to tolerate therapeutic exercise in the bed with emphasis on rotational aspect of movement.  He did well with rolling and transfer from supine to sit to stand.  His standing balance is decreased and he was only able to ambulate 4' with hand held assist.  He will need a walker for progression of gait.  He is still very appropriate for SNF at d/c.  Follow Up Recommendations        Does the patient have the potential to tolerate intense rehabilitation     Barriers to Discharge        Equipment Recommendations       Recommendations for Other Services    Frequency     Progress towards PT Goals Progress towards PT goals: Progressing toward goals  Plan Current plan remains appropriate    Precautions / Restrictions     Pertinent Vitals/Pain     Mobility  Bed Mobility Overal bed mobility: Needs Assistance Bed Mobility: Supine to Sit Rolling: Min assist Supine to sit: Min assist Transfers Overall transfer level: Needs assistance Equipment used: 1 person hand held assist Sit to Stand: Min assist Ambulation/Gait Ambulation/Gait assistance: Mod assist Ambulation Distance (Feet): 4 Feet Assistive device: 1 person hand held assist Gait Pattern/deviations: Shuffle;Festinating;Decreased weight shift to left Gait velocity interpretation: Below normal speed for age/gender General Gait Details: Parkinsonian gait pattern    Exercises General Exercises - Lower Extremity Ankle Circles/Pumps: AROM;Both;10 reps;Supine Heel Slides: AAROM;Both;10 reps;Supine Hip ABduction/ADduction: AAROM;Both;10 reps;Supine Other Exercises Other Exercises: trunk rolling x  8 reps   PT Diagnosis:    PT Problem List:   PT Treatment Interventions:     PT Goals (current goals can now be found in the care plan section)    Visit Information  Last PT Received On: 06/30/13 History of Present Illness: Pt is very alert and oriented today...anxious to try to get OOB and walk.    Subjective Data      Cognition  Cognition Arousal/Alertness: Awake/alert Behavior During Therapy: WFL for tasks assessed/performed Overall Cognitive Status: Within Functional Limits for tasks assessed    Balance  Balance Overall balance assessment: Needs assistance Sitting-balance support: No upper extremity supported;Feet supported Sitting balance-Leahy Scale: Good Standing balance support: No upper extremity supported Standing balance-Leahy Scale: Fair  End of Session PT - End of Session Equipment Utilized During Treatment: Gait belt Activity Tolerance: Patient tolerated treatment well Patient left: in chair;with call bell/phone within reach;with chair alarm set Nurse Communication: Mobility status   GP     Demetrios Isaacs L 06/30/2013, 4:00 PM

## 2013-06-30 NOTE — Evaluation (Signed)
Occupational Therapy Evaluation Patient Details Name: Julian Ramirez MRN: [id number] DOB: 02/05/1927 Today's Date: 06/30/2013 Time: 3557-3220 OT Time Calculation (min): 41 min Evaluation 2542-7062 (25') Self Cares 3762-8315 (24')  OT Assessment / Plan / Recommendation History of present illness Pt is admitted due to right sided weakness, slurred speech and a fall at home where he laid on the floor for a number of hours before being found.  He has Parkinson's disease and peripheral neuropathy at baseline but is able to live alone next door to his son.  Per his son's report, he has been having increasing diffiuctly with ADLs at home.  Son states that they have found him to have a UTI.  CT scan was negative.   Clinical Impression   Patient presents with decreased strength/endurance, decreased functional mobility, decreased ADL independence, increased pain.  Recommend Patient would benefit from skilled therapy services to maximize functional potential/independence.      OT Assessment  Patient needs continued OT Services    Follow Up Recommendations  SNF    Barriers to Discharge Decreased caregiver support patient lives alone, son lives a few houses down   Equipment Recommendations  3 in 1 bedside comode    Recommendations for Other Services    Frequency  Min 5X/week    Precautions / Restrictions Precautions Precautions: Fall Restrictions Weight Bearing Restrictions: No       ADL  Grooming: Set up;Wash/dry hands;Wash/dry face;Brushing hair Where Assessed - Grooming: Supported sitting Lower Body Dressing: Moderate assistance;Maximal assistance Toilet Transfer: Moderate assistance Toilet Transfer Method: Stand pivot ADL Comments: patient requires increased time for ADL tasks attempted this date and increased focus for tremoring dominant R hand.     OT Diagnosis: Generalized weakness  OT Problem List: Decreased strength;Decreased coordination;Pain;Decreased activity  tolerance;Impaired UE functional use OT Treatment Interventions: Self-care/ADL training;Therapeutic exercise;Therapeutic activities;Neuromuscular education;Energy conservation;DME and/or AE instruction;Patient/family education;Manual therapy   OT Goals(Current goals can be found in the care plan section) Acute Rehab OT Goals Patient Stated Goal: retrun to home OT Goal Formulation: With patient Time For Goal Achievement: 07/14/13 Potential to Achieve Goals: Good ADL Goals Pt Will Perform Grooming: with set-up;sitting (will complete at least 4  tasks within reasonable time ) Pt Will Perform Lower Body Dressing: with mod assist;with adaptive equipment Pt Will Transfer to Toilet: with min assist;bedside commode;stand pivot transfer Pt/caregiver will Perform Home Exercise Program: Both right and left upper extremity;With Supervision  Visit Information  History of Present Illness: Pt is admitted due to right sided weakness, slurred speech and a fall at home where he laid on the floor for a number of hours before being found.  He has Parkinson's disease and peripheral neuropathy at baseline but is able to live alone next door to his son.  Per his son's report, he has been having increasing diffiuctly with ADLs at home.  Son states that they have found him to have a UTI.  CT scan was negative.       Prior Functioning     Home Living Family/patient expects to be discharged to:: Skilled nursing facility Living Arrangements: Alone Additional Comments: patient has single point cane, rollator, shower chair and bench, hand held shower Prior Function Level of Independence: Independent Comments: Patient states son assists him as needed with IADL tasks of cleaning and laundry.  Patient also reports he has not felt safe to get legs over tub for showering for last month or so and has been sponge bathing;  reports increased difficulty with LB dressing/donning socks  requiring increased time.   Communication Communication: No difficulties;HOH (hearing aides at home, not available. ) Dominant Hand: Right         Vision/Perception Vision - History Baseline Vision: Wears glasses all the time Patient Visual Report: No change from baseline Vision - Assessment Eye Alignment: Within Functional Limits   Cognition  Cognition Arousal/Alertness: Awake/alert Behavior During Therapy: WFL for tasks assessed/performed Overall Cognitive Status: Within Functional Limits for tasks assessed    Extremity/Trunk Assessment Upper Extremity Assessment Upper Extremity Assessment: Overall WFL for tasks assessed;Generalized weakness (even bilaterally weakness with noted tremors bilaterally R>L) Lower Extremity Assessment Lower Extremity Assessment: Defer to PT evaluation     Mobility Bed Mobility Overal bed mobility: Needs Assistance Transfers Overall transfer level: Needs assistance Transfers: Sit to/from Stand;Stand Pivot Transfers Sit to Stand: Mod assist Stand pivot transfers: Mod assist           End of Session OT - End of Session Equipment Utilized During Treatment: Gait belt Activity Tolerance: Patient tolerated treatment well Patient left: in chair;with call bell/phone within reach;with family/visitor present  [place], OTR/L 06/30/2013, 9:42 AM

## 2013-07-01 ENCOUNTER — Inpatient Hospital Stay
Admission: RE | Admit: 2013-07-01 | Discharge: 2013-07-17 | Disposition: A | Discharge status: Home / Self Care | Payer: Medicare HMO | Source: Ambulatory Visit | Attending: Family Medicine | Admitting: Family Medicine

## 2013-07-01 DIAGNOSIS — M25552 Pain in left hip: Principal | ICD-10-CM

## 2013-07-01 DIAGNOSIS — R52 Pain, unspecified: Secondary | ICD-10-CM

## 2013-07-01 DIAGNOSIS — M549 Dorsalgia, unspecified: Secondary | ICD-10-CM

## 2013-07-01 LAB — GLUCOSE, CAPILLARY: Glucose-Capillary: 108 mg/dL — ABNORMAL HIGH (ref 70–99)

## 2013-07-01 MED ORDER — CARBIDOPA-LEVODOPA 25-100 MG PO TABS: 1.0000 | ORAL_TABLET | Freq: Two times a day (BID) | ORAL | Status: DC

## 2013-07-01 MED ORDER — ASPIRIN 325 MG PO TABS: 325.0000 mg | ORAL_TABLET | Freq: Every day | ORAL | Status: DC

## 2013-07-01 NOTE — Progress Notes (Signed)
Julian Ramirez, Julian Ramirez              ACCOUNT NO.:  [id number]  MEDICAL RECORD NO.:  [id number]  LOCATION:  A302                          FACILITY:  [facility]  PHYSICIAN:  Saharah Sherrow G. Everette Rank, MD   DATE OF BIRTH:  [birth year]  DATE OF PROCEDURE: DATE OF DISCHARGE:                                PROGRESS NOTE   SUBJECTIVE:  This patient is more alert today.  His blood pressure remains in satisfactory range.  He does have parkinsonism and was admitted following a fall at home and had some increased slurring of speech and right-sided weakness, but no other evidence of acute intracranial disease __________ cervical spine and an MRA.  There was no evidence of acute infarct or acute intracranial abnormality.  Moderate cerebral atrophy, minimal small vessel ischemic changes.  No evidence of major intracranial arterial occlusion.  Carotid Doppler and ultrasound __________ 50-69% __________ stenosis.  A 2D echo is pending.  The patient did have a drop in blood pressure.  He had to be bolused with saline.  He does have evidence of a rhabdomyolysis with elevated CPK.  OBJECTIVE:  GENERAL:  The patient was alert and oriented. VITAL SIGNS:  Blood pressure 123/68, respirations 21, pulse 98.4. HEENT:  Eyes, PERRLA.  TM, negative.  Oropharynx, benign. NECK:  Supple.  No JVD or thyroid abnormalities. HEART:  Regular rhythm.  No murmurs. LUNGS:  Clear to P and A. ABDOMEN:  No palpable organs or masses. NEUROLOGIC:  Cranial nerves intact.  No motor or sensory abnormalities. Slight weakness on the right side.  ASSESSMENT:  The patient does have Parkinson disease and rhabdomyolysis with right-sided weakness.  PLAN:  To attempt to ambulate the patient today.  He will be seen in consultation by a neurologist.  We will repeat CPK.     Vieva Brummitt G. Everette Rank, MD     AGM/MEDQ  D:  06/30/2013  T:  06/30/2013  Job:  [id number]

## 2013-07-01 NOTE — Discharge Summary (Signed)
Julian Ramirez, Julian Ramirez              ACCOUNT NO.:  [id number]  MEDICAL RECORD NO.:  [id number]  LOCATION:  A302                          FACILITY:  [facility]  PHYSICIAN:  Denajah Farias G. Everette Rank, MD   DATE OF BIRTH:  27-Mar-1927  DATE OF ADMISSION:  06/28/2013 DATE OF DISCHARGE:  01/10/2015LH                              DISCHARGE SUMMARY   This 80 year old patient was admitted on June 29, 2013, and discharge July 01, 2013, 2 days hospitalization.  DIAGNOSES: 1. Fall at home with development of rhabdomyolysis. 2. Parkinson disease. 3. B12 deficiency. 4. Urinary tract infection.  CONDITION:  Stable at the time of his discharge to [facility].  This patient was brought in through the emergency department with a history of having had a fall at home and subsequent generalized weakness and right-sided weakness.  Apparently, did not lose consciousness with the fall.  There was no evidence of fractures.  EXAMINATION ON ADMISSION:  VITAL SIGNS:  Blood pressure 109/71, pulse 80, and temp 99. GENERAL:  The patient was oriented with slightly slurred speech. HEENT:  Eyes, PERRLA.  TMs negative.  Oropharynx benign. NECK:  Supple.  No JVD or thyroid abnormalities. HEART:  Regular rhythm.  No murmurs. LUNGS:  Clear to P and A. ABDOMEN:  No palpable organs or masses. NEUROLOGICAL:  Cranial nerves II through XII intact.  No motor or sensory abnormalities or abnormal reflexes.  Questionable right-sided weakness.  RADIOLOGY:  CT of the head, no evidence of acute intracranial disease. Chest x-ray, mild bibasilar atelectasis.  LABORATORY DATA:  CBC:  WBC 12,000 with hemoglobin 12.3, hematocrit 35.6.  Liver enzymes:  AST 60, ALT 17, alkaline phosphatase 51, and bilirubin 1.9.  Chemistries:  Sodium 141, potassium 4.3, chloride 102, CO2 of 25, glucose 119, BUN 38, creatinine 1.56, calcium 9.1.  CK total 3319 and CK-MB 5.8.  Vitamin B12 of 188.  Hemoglobin A1c 5.4. Homocystine 10.6.  HOSPITAL  COURSE:  The patient was placed in step-down bed in ICU initially.  He was placed on intravenous fluids and because of low blood pressure, he had to be bolused with saline.  He was continued on aspirin 325 mg daily, Sinemet 25/100 b.i.d.  He was placed on IV Cipro 400 mg every 12 hours because of urinary tract infection, Neurontin 300 mg b.i.d., was given Lovenox injections for DVT prophylaxis.  Patient initially was lethargic but did improve.  His x-rays, MRI of brain, and __________ did not show acute abnormalities.  The patient gradually improved and was able to be transferred to med surgical bed where he has remained more alert.  Placement in nursing facility for further rehab was desired and accomplished.  He was seen in consultation by Neurology, who agreed with the plan.  He noted that his parkinsonism has improved somewhat.  He felt that the gabapentin was helpful, but dosage could be increased to 3 times a day.     Jaylinn Hellenbrand G. Everette Rank, MD     AGM/MEDQ  D:  07/01/2013  T:  07/01/2013  Job:  [id number]

## 2013-07-01 NOTE — Discharge Summary (Signed)
[name], Julian Ramirez              ACCOUNT NO.:  [id number]  MEDICAL RECORD NO.:  [id number]  LOCATION:  A302                          FACILITY:  [facility]  PHYSICIAN:  Valarie Farace G. Everette Rank, MD   DATE OF BIRTH:  May 06, 1927  DATE OF ADMISSION:  06/28/2013 DATE OF DISCHARGE:  01/10/2015LH                              DISCHARGE SUMMARY   ADDENDUM  The patient will be continued on the following medications.  Sinemet 25/100 one b.i.d., Proscar 5 mg daily, Lasix 20 mg daily, Flomax 0.4 mg daily, tramadol 50 mg daily, potassium chloride 20 mEq daily, Neurontin 300 mg 3 times daily, Zocor 40 mg daily, Isopto tears 2.5% solution 1 drop in both eyes.  The patient will be given a prescription for Cipro 500 mg 1 b.i.d. to be taken for approximately 1 week.     [name], MD     AGM/MEDQ  D:  07/01/2013  T:  07/01/2013  Job:  [id number]

## 2013-07-01 NOTE — Progress Notes (Signed)
Pt a/o.vss. Saline lock removed. No complaints of any distress. Report called to the nurse at [facility], caregiver verbalized understanding of report. Pt to be transferred to facility via wheelchair.

## 2013-07-03 LAB — CULTURE, BLOOD (ROUTINE X 2)

## 2013-07-13 ENCOUNTER — Ambulatory Visit (HOSPITAL_COMMUNITY): Payer: Medicare HMO | Attending: Family Medicine

## 2013-07-13 DIAGNOSIS — M545 Low back pain, unspecified: Secondary | ICD-10-CM | POA: Insufficient documentation

## 2013-07-13 DIAGNOSIS — M25519 Pain in unspecified shoulder: Secondary | ICD-10-CM | POA: Insufficient documentation

## 2013-07-13 DIAGNOSIS — M25559 Pain in unspecified hip: Secondary | ICD-10-CM | POA: Insufficient documentation

## 2013-08-18 ENCOUNTER — Emergency Department (HOSPITAL_COMMUNITY): Payer: Medicare HMO

## 2013-08-18 ENCOUNTER — Encounter (HOSPITAL_COMMUNITY): Payer: Self-pay | Admitting: Emergency Medicine

## 2013-08-18 ENCOUNTER — Emergency Department (HOSPITAL_COMMUNITY)
Admission: EM | Admit: 2013-08-18 | Discharge: 2013-08-18 | Disposition: A | Discharge status: Home / Self Care | Payer: Medicare HMO | Attending: Emergency Medicine | Admitting: Emergency Medicine

## 2013-08-18 DIAGNOSIS — G20 Parkinson's disease: Secondary | ICD-10-CM

## 2013-08-18 DIAGNOSIS — S20212A Contusion of left front wall of thorax, initial encounter: Secondary | ICD-10-CM

## 2013-08-18 DIAGNOSIS — IMO0002 Reserved for concepts with insufficient information to code with codable children: Secondary | ICD-10-CM | POA: Insufficient documentation

## 2013-08-18 DIAGNOSIS — G8929 Other chronic pain: Secondary | ICD-10-CM | POA: Insufficient documentation

## 2013-08-18 DIAGNOSIS — S4980XA Other specified injuries of shoulder and upper arm, unspecified arm, initial encounter: Secondary | ICD-10-CM | POA: Insufficient documentation

## 2013-08-18 DIAGNOSIS — Y9389 Activity, other specified: Secondary | ICD-10-CM | POA: Insufficient documentation

## 2013-08-18 DIAGNOSIS — W19XXXA Unspecified fall, initial encounter: Secondary | ICD-10-CM

## 2013-08-18 DIAGNOSIS — R5381 Other malaise: Secondary | ICD-10-CM | POA: Insufficient documentation

## 2013-08-18 DIAGNOSIS — S20219A Contusion of unspecified front wall of thorax, initial encounter: Secondary | ICD-10-CM | POA: Insufficient documentation

## 2013-08-18 DIAGNOSIS — S0990XA Unspecified injury of head, initial encounter: Secondary | ICD-10-CM | POA: Insufficient documentation

## 2013-08-18 DIAGNOSIS — Y929 Unspecified place or not applicable: Secondary | ICD-10-CM | POA: Insufficient documentation

## 2013-08-18 DIAGNOSIS — R42 Dizziness and giddiness: Secondary | ICD-10-CM | POA: Insufficient documentation

## 2013-08-18 DIAGNOSIS — I10 Essential (primary) hypertension: Secondary | ICD-10-CM | POA: Insufficient documentation

## 2013-08-18 DIAGNOSIS — S46909A Unspecified injury of unspecified muscle, fascia and tendon at shoulder and upper arm level, unspecified arm, initial encounter: Secondary | ICD-10-CM | POA: Insufficient documentation

## 2013-08-18 DIAGNOSIS — R5383 Other fatigue: Secondary | ICD-10-CM

## 2013-08-18 DIAGNOSIS — W1809XA Striking against other object with subsequent fall, initial encounter: Secondary | ICD-10-CM | POA: Insufficient documentation

## 2013-08-18 DIAGNOSIS — Z880 Allergy status to penicillin: Secondary | ICD-10-CM | POA: Insufficient documentation

## 2013-08-18 DIAGNOSIS — G20A1 Parkinson's disease without dyskinesia, without mention of fluctuations: Secondary | ICD-10-CM | POA: Insufficient documentation

## 2013-08-18 DIAGNOSIS — Z79899 Other long term (current) drug therapy: Secondary | ICD-10-CM | POA: Insufficient documentation

## 2013-08-18 LAB — CBC WITH DIFFERENTIAL/PLATELET
BASOS ABS: 0 10*3/uL (ref 0.0–0.1)
Basophils Relative: 0 % (ref 0–1)
EOS PCT: 4 % (ref 0–5)
Eosinophils Absolute: 0.1 10*3/uL (ref 0.0–0.7)
HCT: 29.6 % — ABNORMAL LOW (ref 39.0–52.0)
HEMOGLOBIN: 10.1 g/dL — AB (ref 13.0–17.0)
Lymphocytes Relative: 15 % (ref 12–46)
Lymphs Abs: 0.4 10*3/uL — ABNORMAL LOW (ref 0.7–4.0)
MCH: 30.4 pg (ref 26.0–34.0)
MCHC: 34.1 g/dL (ref 30.0–36.0)
MCV: 89.2 fL (ref 78.0–100.0)
MONO ABS: 0.3 10*3/uL (ref 0.1–1.0)
Monocytes Relative: 11 % (ref 3–12)
Neutro Abs: 2 10*3/uL (ref 1.7–7.7)
Neutrophils Relative %: 70 % (ref 43–77)
Platelets: 175 10*3/uL (ref 150–400)
RBC: 3.32 MIL/uL — ABNORMAL LOW (ref 4.22–5.81)
RDW: 13.6 % (ref 11.5–15.5)
WBC: 2.9 10*3/uL — ABNORMAL LOW (ref 4.0–10.5)

## 2013-08-18 LAB — COMPREHENSIVE METABOLIC PANEL
ALT: 8 U/L (ref 0–53)
AST: 9 U/L (ref 0–37)
Albumin: 3.3 g/dL — ABNORMAL LOW (ref 3.5–5.2)
Alkaline Phosphatase: 92 U/L (ref 39–117)
BILIRUBIN TOTAL: 0.7 mg/dL (ref 0.3–1.2)
BUN: 18 mg/dL (ref 6–23)
CALCIUM: 8.8 mg/dL (ref 8.4–10.5)
CHLORIDE: 98 meq/L (ref 96–112)
CO2: 30 mEq/L (ref 19–32)
CREATININE: 1.24 mg/dL (ref 0.50–1.35)
GFR, EST AFRICAN AMERICAN: 60 mL/min — AB (ref >90)
GFR, EST NON AFRICAN AMERICAN: 52 mL/min — AB (ref >90)
GLUCOSE: 95 mg/dL (ref 70–99)
Potassium: 3.8 mEq/L (ref 3.7–5.3)
Sodium: 138 mEq/L (ref 137–147)
Total Protein: 6.7 g/dL (ref 6.0–8.3)

## 2013-08-18 LAB — URINALYSIS, ROUTINE W REFLEX MICROSCOPIC
Bilirubin Urine: NEGATIVE
Glucose, UA: NEGATIVE mg/dL
Ketones, ur: NEGATIVE mg/dL
LEUKOCYTES UA: NEGATIVE
NITRITE: NEGATIVE
PROTEIN: NEGATIVE mg/dL
SPECIFIC GRAVITY, URINE: 1.025 (ref 1.005–1.030)
Urobilinogen, UA: 0.2 mg/dL (ref 0.0–1.0)
pH: 5.5 (ref 5.0–8.0)

## 2013-08-18 LAB — URINE MICROSCOPIC-ADD ON

## 2013-08-18 MED ORDER — SODIUM CHLORIDE 0.9 % IV SOLN: 1000.0000 mL | INTRAVENOUS | Status: DC

## 2013-08-18 MED ORDER — OXYCODONE-ACETAMINOPHEN 5-325 MG PO TABS
1.0000 | ORAL_TABLET | Freq: Once | ORAL | Status: AC
  Administered 2013-08-18: 1 via ORAL
  Filled 2013-08-18: qty 1

## 2013-08-18 MED ORDER — SODIUM CHLORIDE 0.9 % IV SOLN
1000.0000 mL | Freq: Once | INTRAVENOUS | Status: AC
  Administered 2013-08-18: 1000 mL via INTRAVENOUS

## 2013-08-18 NOTE — ED Provider Notes (Signed)
CSN: [id number]     Arrival date & time 08/18/13  1128 History  This chart was scribed for Hoy Morn, MD by Ludger Nutting, ED Scribe. This patient was seen in room APA03/APA03 and the patient's care was started 12:16 PM.    Chief Complaint  Patient presents with  . Fall      The history is provided by the patient. No language interpreter was used.    HPI Comments: Julian Ramirez is a 80 y.o. male with past medical history of Parkinson disease brought in by ambulance, who presents to the Emergency Department complaining of a fall that occurred last night. Pt states he uses a walker at baseline and woke up to use the bathroom. He was attempting to sit in his recliner when he tripped and fell striking his head and back. He reports feeling dizziness prior to the fall but denies LOC. He states it took him longer than normal to stand back up because he was in pain and having increased weakness. He complains of pain to the entire left side of the body. He reports similar falls that have become more frequent lately. He reports having left arm pain and weakness that has been gradually worsening for the past few weeks. He reports recent weight loss due to lack of appetite. He does not take any blood thinners. He takes oxycodone and tramadol for chronic pain. He denies vomiting, diarrhea, fever, chills, cough, congestion.  Past Medical History  Diagnosis Date  . Hypertension   . Parkinson disease   . Neuropathy   . Edema    History reviewed. No pertinent past surgical history. Family History  Problem Relation Age of Onset  . Heart attack Mother   . Heart attack Father    History  Substance Use Topics  . Smoking status: Never Smoker   . Smokeless tobacco: Not on file  . Alcohol Use: No    Review of Systems  A complete 10 system review of systems was obtained and all systems are negative except as noted in the HPI and PMH.    Allergies  Penicillins; Peanuts; and Wheat bran  Home  Medications   Current Outpatient Rx  Name  Route  Sig  Dispense  Refill  . alendronate (FOSAMAX) 70 MG tablet   Oral   Take 70 mg by mouth once a week. Takes on Wednesday.  Take with a full glass of water on an empty stomach.         . carbidopa-levodopa (SINEMET IR) 25-100 MG per tablet   Oral   Take 1 tablet by mouth 3 (three) times daily.         . cholecalciferol (VITAMIN D) 1000 UNITS tablet   Oral   Take 1,000 Units by mouth daily.         Marland Kitchen docusate sodium (COLACE) 50 MG capsule   Oral   Take 150 mg by mouth 2 (two) times daily.         . finasteride (PROSCAR) 5 MG tablet   Oral   Take 5 mg by mouth daily.         . furosemide (LASIX) 20 MG tablet   Oral   Take 20 mg by mouth 2 (two) times daily.         Marland Kitchen gabapentin (NEURONTIN) 300 MG capsule   Oral   Take 300 mg by mouth 4 (four) times daily.          Marland Kitchen lidocaine (LIDODERM) 5 %  Transdermal   Place 1 patch onto the skin every 12 (twelve) hours. Remove & Discard patch within 12 hours or as directed by MD         . omeprazole (PRILOSEC) 20 MG capsule   Oral   Take 20 mg by mouth 2 (two) times daily before a meal.          . oxyCODONE (OXY IR/ROXICODONE) 5 MG immediate release tablet   Oral   Take 5 mg by mouth every 2 (two) hours as needed for severe pain.         . potassium chloride SA (K-DUR,KLOR-CON) 20 MEQ tablet   Oral   Take 20 mEq by mouth daily.         . simvastatin (ZOCOR) 20 MG tablet   Oral   Take 20 mg by mouth daily.         . tamsulosin (FLOMAX) 0.4 MG CAPS capsule   Oral   Take 0.4 mg by mouth at bedtime.         . traMADol (ULTRAM) 50 MG tablet   Oral   Take 50 mg by mouth daily as needed for moderate pain.          . traZODone (DESYREL) 50 MG tablet   Oral   Take 50 mg by mouth at bedtime.         . vitamin B-12 (CYANOCOBALAMIN) 1000 MCG tablet   Oral   Take 1,000 mcg by mouth daily.          BP 106/53  Pulse 70  Temp(Src) 98 F (36.7 C)  (Oral)  Resp 18  SpO2 95% Physical Exam  Nursing note and vitals reviewed. Constitutional: He is oriented to person, place, and time. He appears well-developed and well-nourished.  HENT:  Head: Normocephalic and atraumatic.  Eyes: EOM are normal.  Neck: Normal range of motion.  Cardiovascular: Normal rate, regular rhythm, normal heart sounds and intact distal pulses.   Pulmonary/Chest: Effort normal and breath sounds normal. No respiratory distress. He exhibits tenderness (mild left sided).  Abdominal: Soft. He exhibits no distension. There is no tenderness.  Musculoskeletal: Normal range of motion. He exhibits tenderness. He exhibits no edema.  5/5 strength in his upper and lower extremities (albeit slowed and delayed movements of lower extremities).  Mild contracture of RUE with pain and right bicep region with extension at right elbow.  Full ROM of bilateral wrists and elbows.  Mid thoracic tenderness. Mild tenderness to tailbone. No ecchymosis of the back.   Neurological: He is alert and oriented to person, place, and time.  Skin: Skin is warm and dry.  Psychiatric: He has a normal mood and affect. Judgment normal.    ED Course  Procedures (including critical care time)  DIAGNOSTIC STUDIES: Oxygen Saturation is 95% on RA, adequate by my interpretation.    COORDINATION OF CARE: 12:29 PM Will order XRAY's of lumbar spine, thoracic spine, and chest along with a head CT. Will order lab work. Discussed treatment plan with pt at bedside and pt agreed to plan.  Medications  0.9 %  sodium chloride infusion (1,000 mLs Intravenous New Bag/Given 08/18/13 1241)    Followed by  0.9 %  sodium chloride infusion (not administered)  oxyCODONE-acetaminophen (PERCOCET/ROXICET) 5-325 MG per tablet 1 tablet (1 tablet Oral Given 08/18/13 1238)      Labs Review Labs Reviewed  CBC WITH DIFFERENTIAL - Abnormal; Notable for the following:    WBC 2.9 (*)    RBC  3.32 (*)    Hemoglobin 10.1 (*)     HCT 29.6 (*)    Lymphs Abs 0.4 (*)    All other components within normal limits  COMPREHENSIVE METABOLIC PANEL - Abnormal; Notable for the following:    Albumin 3.3 (*)    GFR calc non Af Amer 52 (*)    GFR calc Af Amer 60 (*)    All other components within normal limits  URINALYSIS, ROUTINE W REFLEX MICROSCOPIC - Abnormal; Notable for the following:    Color, Urine AMBER (*)    Hgb urine dipstick TRACE (*)    All other components within normal limits  URINE MICROSCOPIC-ADD ON   Imaging Review Dg Chest 1 View  08/18/2013   CLINICAL DATA:  Status post fall with left back pain.  EXAM: CHEST - 1 VIEW  COMPARISON:  June 29, 2013  FINDINGS: The heart size and mediastinal contours are stable. The aorta is tortuous. The heart size is mildly enlarged. There is mild atelectasis of both lung bases unchanged compared to prior exam. There is no pulmonary edema or pleural effusion. The visualized skeletal structures are stable.  IMPRESSION: Stable mild atelectasis of both lung bases. The visualized left ribs are normal.   Electronically Signed   By: Abelardo Diesel M.D.   On: 08/18/2013 13:41   Dg Thoracic Spine 2 View  08/18/2013   CLINICAL DATA:  Status post fall with back pain.  EXAM: THORACIC SPINE - 2 VIEW  COMPARISON:  None.  FINDINGS: There is no evidence of thoracic spine fracture. There is scoliosis of spine. Degenerative joint changes of the spine with narrowed joint space and osteophyte formation are noted.  IMPRESSION: No acute fracture or dislocation.   Electronically Signed   By: Abelardo Diesel M.D.   On: 08/18/2013 13:42   Dg Lumbar Spine Complete  08/18/2013   CLINICAL DATA:  Status post fall with back pain  EXAM: LUMBAR SPINE - COMPLETE 4+ VIEW  COMPARISON:  July 13, 2013  FINDINGS: There is no evidence of lumbar spine fracture. There are degenerative joint changes of the spine with narrowed joint space and osteophyte formation. There is chronic compression deformity of L3 unchanged  compared to prior exam. IVC filter is unchanged.  IMPRESSION: No acute fracture or dislocation.   Electronically Signed   By: Abelardo Diesel M.D.   On: 08/18/2013 13:43   Ct Head Wo Contrast  08/18/2013   CLINICAL DATA:  Status post fall, hit back of the head. Left-sided weakness  EXAM: CT HEAD WITHOUT CONTRAST  TECHNIQUE: Contiguous axial images were obtained from the base of the skull through the vertex without intravenous contrast.  COMPARISON:  Head CT June 29, 2013  FINDINGS: There is chronic diffuse atrophy. There is no midline shift, hydrocephalus, or mass. No acute hemorrhage or acute transcortical infarct is identified. For holes are identified in bilateral parietal skull unchanged. The visualized sinuses are clear.  IMPRESSION: No focal acute intracranial abnormality identified.   Electronically Signed   By: Abelardo Diesel M.D.   On: 08/18/2013 13:18  I personally reviewed the imaging tests through PACS system I reviewed available ER/hospitalization records through the EMR     MDM   Final diagnoses:  Fall  Contusion of left chest wall  Parkinson disease    3:11 PM Patient feels better at this time.  He states his weakness is improved.  He states his pain is improved.  He seems like he is having some ongoing ambulatory  issues likely from worsening of his Parkinson's  Asked the pt to follow up with his primary care team and his neurologist closely.  Patient is overall well-appearing.  Imaging without acute fracture.  Labs without significant abnormalities.  I personally performed the services described in this documentation, which was scribed in my presence. The recorded information has been reviewed and is accurate.      Hoy Morn, MD 08/18/13 [phone]

## 2013-08-18 NOTE — ED Notes (Signed)
Pt fell last night from standing position, uses walker and slipped. Pt did strike head but denies LOC.  Pt co rt shoulder and lt hip pain.

## 2013-08-18 NOTE — ED Notes (Signed)
Pt comes from home via EMS. Pt reports fall last night and c/o right arm and left hip pain. Pt states he "got dizzy while walking" and fell to ground. Pt hit his head but denies LOC. Pt uses walker.

## 2013-08-18 NOTE — ED Notes (Signed)
Called EMS for transport back home 

## 2014-08-23 ENCOUNTER — Ambulatory Visit (HOSPITAL_COMMUNITY)
Admission: RE | Admit: 2014-08-23 | Discharge: 2014-08-23 | Disposition: A | Discharge status: Home / Self Care | Payer: Medicare HMO | Source: Ambulatory Visit | Attending: Family Medicine | Admitting: Family Medicine

## 2014-08-23 ENCOUNTER — Other Ambulatory Visit (HOSPITAL_COMMUNITY): Payer: Self-pay | Admitting: Family Medicine

## 2014-08-23 DIAGNOSIS — M160 Bilateral primary osteoarthritis of hip: Secondary | ICD-10-CM | POA: Insufficient documentation

## 2014-08-23 DIAGNOSIS — M545 Low back pain: Secondary | ICD-10-CM | POA: Diagnosis not present

## 2014-08-23 DIAGNOSIS — R262 Difficulty in walking, not elsewhere classified: Secondary | ICD-10-CM

## 2014-08-23 DIAGNOSIS — M79671 Pain in right foot: Secondary | ICD-10-CM | POA: Diagnosis not present

## 2014-08-23 DIAGNOSIS — I700 Atherosclerosis of aorta: Secondary | ICD-10-CM | POA: Diagnosis not present

## 2014-08-23 DIAGNOSIS — M25552 Pain in left hip: Secondary | ICD-10-CM

## 2014-09-12 ENCOUNTER — Ambulatory Visit: Payer: Medicare HMO | Admitting: Orthopedic Surgery

## 2014-09-13 ENCOUNTER — Ambulatory Visit: Payer: Medicare HMO | Admitting: Orthopedic Surgery

## 2014-10-04 ENCOUNTER — Ambulatory Visit: Payer: Medicare HMO | Admitting: Orthopedic Surgery

## 2015-06-03 DIAGNOSIS — Z23 Encounter for immunization: Secondary | ICD-10-CM | POA: Diagnosis not present

## 2015-06-23 HISTORY — PX: CHOLECYSTECTOMY: SHX55

## 2015-07-03 DIAGNOSIS — M545 Low back pain: Secondary | ICD-10-CM | POA: Diagnosis not present

## 2015-07-03 DIAGNOSIS — K219 Gastro-esophageal reflux disease without esophagitis: Secondary | ICD-10-CM | POA: Diagnosis not present

## 2015-07-03 DIAGNOSIS — F331 Major depressive disorder, recurrent, moderate: Secondary | ICD-10-CM | POA: Diagnosis not present

## 2015-07-03 DIAGNOSIS — E782 Mixed hyperlipidemia: Secondary | ICD-10-CM | POA: Diagnosis not present

## 2015-07-03 DIAGNOSIS — G20 Parkinson's disease: Secondary | ICD-10-CM | POA: Diagnosis not present

## 2015-09-11 DIAGNOSIS — G20 Parkinson's disease: Secondary | ICD-10-CM | POA: Diagnosis not present

## 2015-09-11 DIAGNOSIS — L89151 Pressure ulcer of sacral region, stage 1: Secondary | ICD-10-CM | POA: Diagnosis not present

## 2015-09-11 DIAGNOSIS — Z880 Allergy status to penicillin: Secondary | ICD-10-CM | POA: Diagnosis not present

## 2015-09-11 DIAGNOSIS — L03312 Cellulitis of back [any part except buttock]: Secondary | ICD-10-CM | POA: Diagnosis not present

## 2015-09-11 DIAGNOSIS — Z79899 Other long term (current) drug therapy: Secondary | ICD-10-CM | POA: Diagnosis not present

## 2015-10-01 DIAGNOSIS — M545 Low back pain: Secondary | ICD-10-CM | POA: Diagnosis not present

## 2015-10-01 DIAGNOSIS — M6281 Muscle weakness (generalized): Secondary | ICD-10-CM | POA: Diagnosis not present

## 2015-10-01 DIAGNOSIS — G20 Parkinson's disease: Secondary | ICD-10-CM | POA: Diagnosis not present

## 2015-10-01 DIAGNOSIS — R278 Other lack of coordination: Secondary | ICD-10-CM | POA: Diagnosis not present

## 2015-10-01 DIAGNOSIS — R627 Adult failure to thrive: Secondary | ICD-10-CM | POA: Diagnosis not present

## 2015-10-01 DIAGNOSIS — G8929 Other chronic pain: Secondary | ICD-10-CM | POA: Diagnosis not present

## 2015-10-01 DIAGNOSIS — R2689 Other abnormalities of gait and mobility: Secondary | ICD-10-CM | POA: Diagnosis not present

## 2015-10-01 DIAGNOSIS — I350 Nonrheumatic aortic (valve) stenosis: Secondary | ICD-10-CM | POA: Diagnosis not present

## 2015-10-01 DIAGNOSIS — M71552 Other bursitis, not elsewhere classified, left hip: Secondary | ICD-10-CM | POA: Diagnosis not present

## 2015-10-06 IMAGING — CR DG LUMBAR SPINE COMPLETE 4+V
5 series · 5 of 5 positions shown · non-contrast
Comparison: MR 05/09/2013

CLINICAL DATA: Low back and left hip pain

EXAM:
LUMBAR SPINE - COMPLETE 4+ VIEW

[view not recorded (1 of 5)]
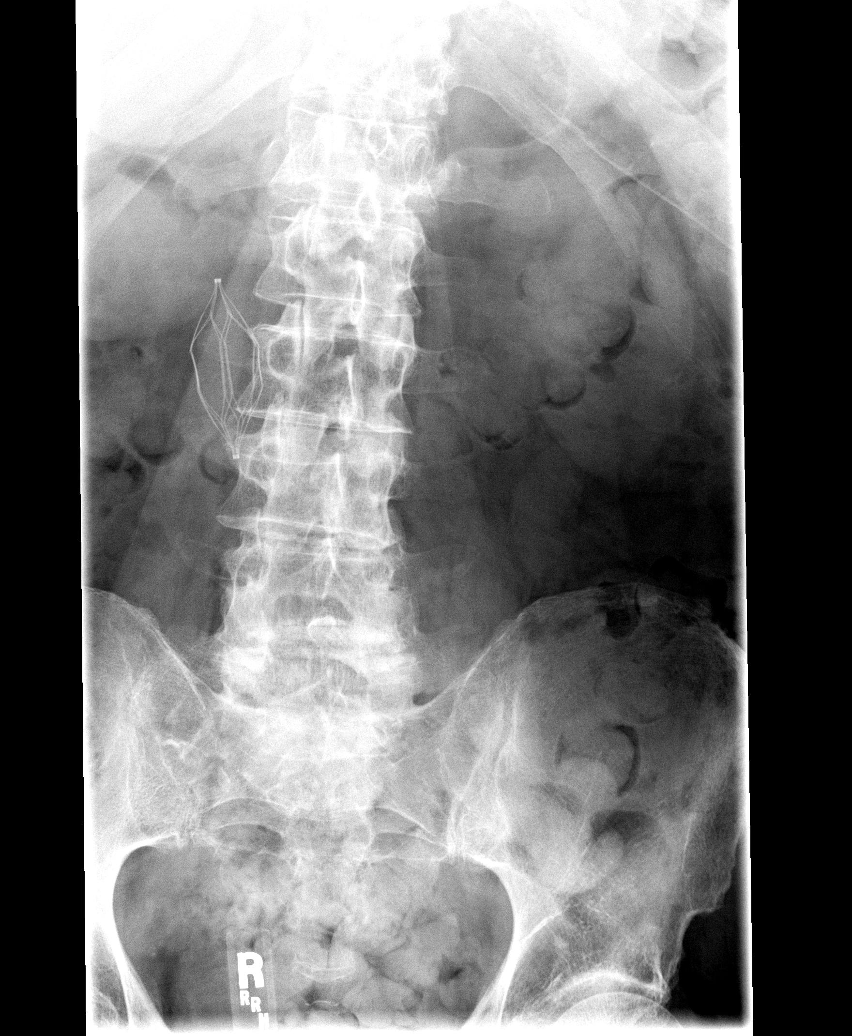

[view not recorded (2 of 5)]
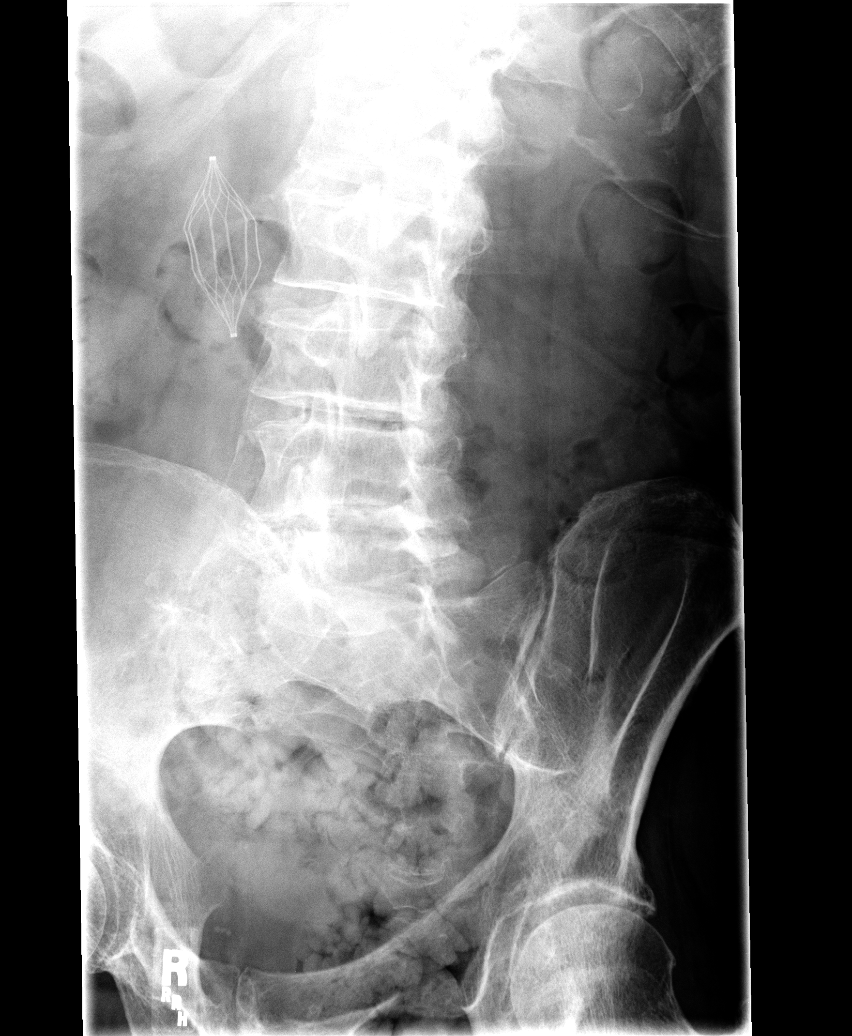

[view not recorded (3 of 5)]
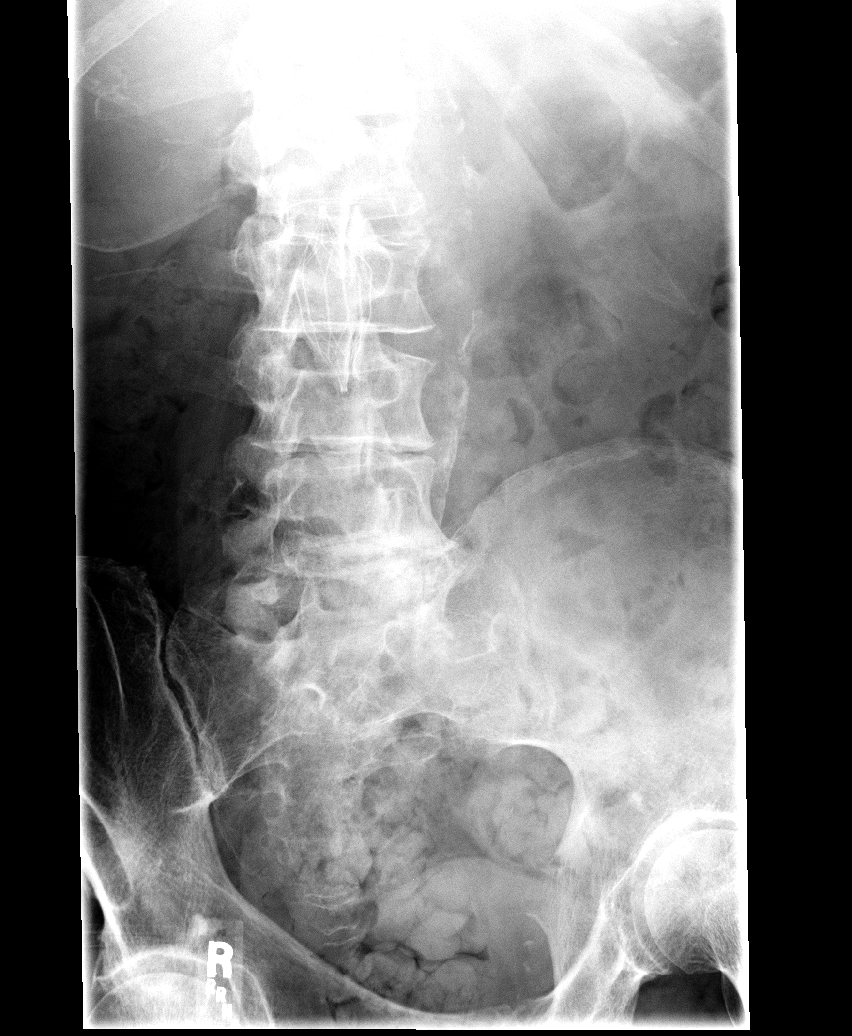

[view not recorded (4 of 5)]
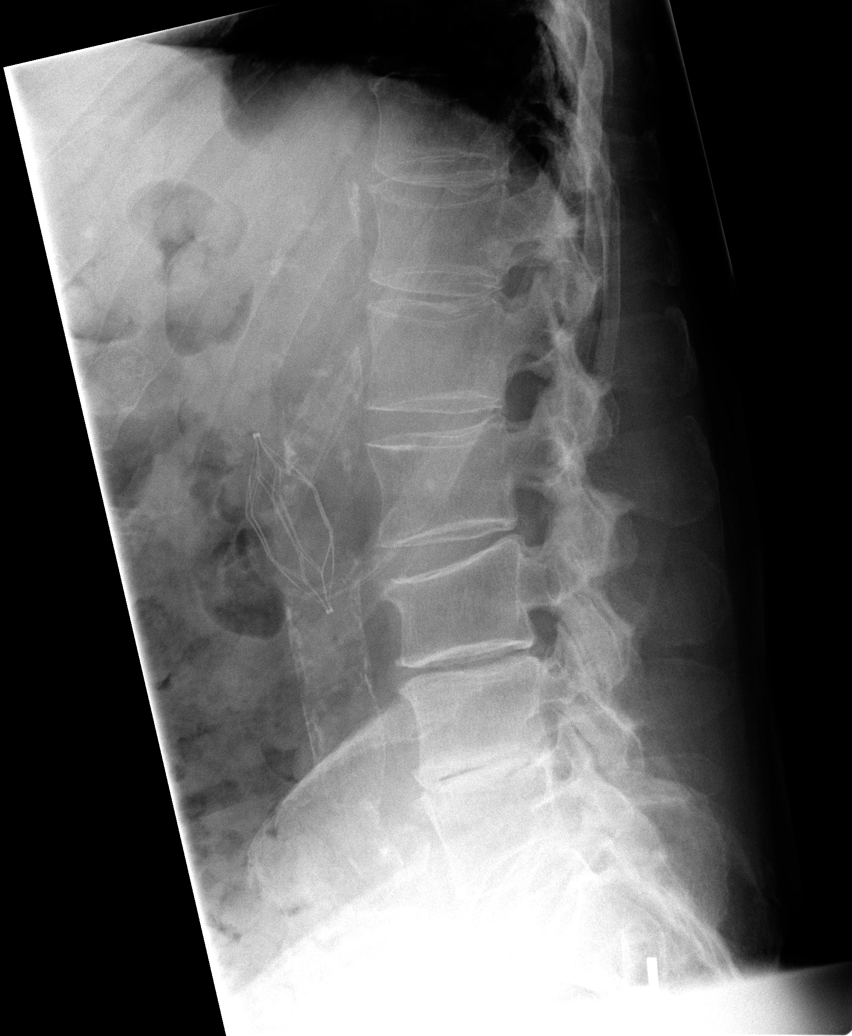

[view not recorded (5 of 5)]
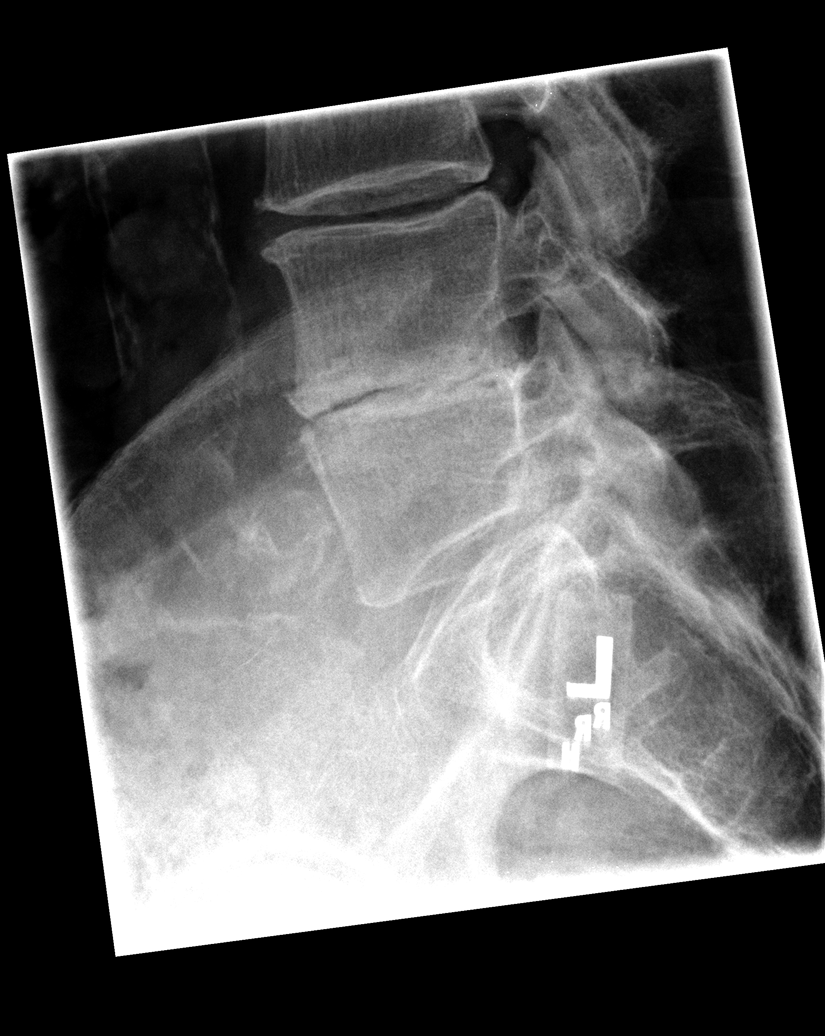

[5 of 5 positions shown; findings below may reference images not displayed]

FINDINGS: Mild wedge compression deformity of L3 stable. Moderate narrowing
L3-4 and L4-5 interspaces. Facet DJD bilaterally L4-5 and L5-S1. No
acute fracture. Normal alignment. IVC filter at the L 2 level.
Aortic calcifications suggest 3.1 cm fusiform aneurysm.
IMPRESSION: 1. Negative for fracture or other acute abnormality.
2. Degenerative changes L3- S1 as above.

## 2015-10-21 DIAGNOSIS — R2689 Other abnormalities of gait and mobility: Secondary | ICD-10-CM | POA: Diagnosis not present

## 2015-10-21 DIAGNOSIS — M6281 Muscle weakness (generalized): Secondary | ICD-10-CM | POA: Diagnosis not present

## 2015-10-21 DIAGNOSIS — W19XXXA Unspecified fall, initial encounter: Secondary | ICD-10-CM | POA: Diagnosis not present

## 2015-10-21 DIAGNOSIS — G609 Hereditary and idiopathic neuropathy, unspecified: Secondary | ICD-10-CM | POA: Diagnosis not present

## 2015-10-21 DIAGNOSIS — G20 Parkinson's disease: Secondary | ICD-10-CM | POA: Diagnosis not present

## 2015-10-26 DIAGNOSIS — L89150 Pressure ulcer of sacral region, unstageable: Secondary | ICD-10-CM | POA: Diagnosis not present

## 2015-10-26 DIAGNOSIS — I10 Essential (primary) hypertension: Secondary | ICD-10-CM | POA: Diagnosis not present

## 2015-10-26 DIAGNOSIS — M6281 Muscle weakness (generalized): Secondary | ICD-10-CM | POA: Diagnosis not present

## 2015-10-26 DIAGNOSIS — G20 Parkinson's disease: Secondary | ICD-10-CM | POA: Diagnosis not present

## 2015-10-26 DIAGNOSIS — R627 Adult failure to thrive: Secondary | ICD-10-CM | POA: Diagnosis not present

## 2015-10-26 DIAGNOSIS — I951 Orthostatic hypotension: Secondary | ICD-10-CM | POA: Diagnosis not present

## 2015-10-29 DIAGNOSIS — G20 Parkinson's disease: Secondary | ICD-10-CM | POA: Diagnosis not present

## 2015-10-29 DIAGNOSIS — I951 Orthostatic hypotension: Secondary | ICD-10-CM | POA: Diagnosis not present

## 2015-10-29 DIAGNOSIS — I10 Essential (primary) hypertension: Secondary | ICD-10-CM | POA: Diagnosis not present

## 2015-10-29 DIAGNOSIS — R627 Adult failure to thrive: Secondary | ICD-10-CM | POA: Diagnosis not present

## 2015-10-29 DIAGNOSIS — L89150 Pressure ulcer of sacral region, unstageable: Secondary | ICD-10-CM | POA: Diagnosis not present

## 2015-10-29 DIAGNOSIS — M6281 Muscle weakness (generalized): Secondary | ICD-10-CM | POA: Diagnosis not present

## 2015-10-30 DIAGNOSIS — R627 Adult failure to thrive: Secondary | ICD-10-CM | POA: Diagnosis not present

## 2015-10-30 DIAGNOSIS — L89150 Pressure ulcer of sacral region, unstageable: Secondary | ICD-10-CM | POA: Diagnosis not present

## 2015-10-30 DIAGNOSIS — G20 Parkinson's disease: Secondary | ICD-10-CM | POA: Diagnosis not present

## 2015-10-30 DIAGNOSIS — I10 Essential (primary) hypertension: Secondary | ICD-10-CM | POA: Diagnosis not present

## 2015-10-30 DIAGNOSIS — I951 Orthostatic hypotension: Secondary | ICD-10-CM | POA: Diagnosis not present

## 2015-10-30 DIAGNOSIS — M6281 Muscle weakness (generalized): Secondary | ICD-10-CM | POA: Diagnosis not present

## 2015-10-31 DIAGNOSIS — I10 Essential (primary) hypertension: Secondary | ICD-10-CM | POA: Diagnosis not present

## 2015-10-31 DIAGNOSIS — M6281 Muscle weakness (generalized): Secondary | ICD-10-CM | POA: Diagnosis not present

## 2015-10-31 DIAGNOSIS — I951 Orthostatic hypotension: Secondary | ICD-10-CM | POA: Diagnosis not present

## 2015-10-31 DIAGNOSIS — L89150 Pressure ulcer of sacral region, unstageable: Secondary | ICD-10-CM | POA: Diagnosis not present

## 2015-10-31 DIAGNOSIS — R627 Adult failure to thrive: Secondary | ICD-10-CM | POA: Diagnosis not present

## 2015-10-31 DIAGNOSIS — G20 Parkinson's disease: Secondary | ICD-10-CM | POA: Diagnosis not present

## 2015-11-01 DIAGNOSIS — I951 Orthostatic hypotension: Secondary | ICD-10-CM | POA: Diagnosis not present

## 2015-11-01 DIAGNOSIS — L89150 Pressure ulcer of sacral region, unstageable: Secondary | ICD-10-CM | POA: Diagnosis not present

## 2015-11-01 DIAGNOSIS — R627 Adult failure to thrive: Secondary | ICD-10-CM | POA: Diagnosis not present

## 2015-11-01 DIAGNOSIS — I10 Essential (primary) hypertension: Secondary | ICD-10-CM | POA: Diagnosis not present

## 2015-11-01 DIAGNOSIS — M6281 Muscle weakness (generalized): Secondary | ICD-10-CM | POA: Diagnosis not present

## 2015-11-01 DIAGNOSIS — G20 Parkinson's disease: Secondary | ICD-10-CM | POA: Diagnosis not present

## 2015-11-05 DIAGNOSIS — R627 Adult failure to thrive: Secondary | ICD-10-CM | POA: Diagnosis not present

## 2015-11-05 DIAGNOSIS — I951 Orthostatic hypotension: Secondary | ICD-10-CM | POA: Diagnosis not present

## 2015-11-05 DIAGNOSIS — L89150 Pressure ulcer of sacral region, unstageable: Secondary | ICD-10-CM | POA: Diagnosis not present

## 2015-11-05 DIAGNOSIS — G20 Parkinson's disease: Secondary | ICD-10-CM | POA: Diagnosis not present

## 2015-11-05 DIAGNOSIS — I10 Essential (primary) hypertension: Secondary | ICD-10-CM | POA: Diagnosis not present

## 2015-11-05 DIAGNOSIS — M6281 Muscle weakness (generalized): Secondary | ICD-10-CM | POA: Diagnosis not present

## 2015-11-06 DIAGNOSIS — G20 Parkinson's disease: Secondary | ICD-10-CM | POA: Diagnosis not present

## 2015-11-06 DIAGNOSIS — R627 Adult failure to thrive: Secondary | ICD-10-CM | POA: Diagnosis not present

## 2015-11-06 DIAGNOSIS — I10 Essential (primary) hypertension: Secondary | ICD-10-CM | POA: Diagnosis not present

## 2015-11-06 DIAGNOSIS — M6281 Muscle weakness (generalized): Secondary | ICD-10-CM | POA: Diagnosis not present

## 2015-11-06 DIAGNOSIS — L89150 Pressure ulcer of sacral region, unstageable: Secondary | ICD-10-CM | POA: Diagnosis not present

## 2015-11-06 DIAGNOSIS — I951 Orthostatic hypotension: Secondary | ICD-10-CM | POA: Diagnosis not present

## 2015-11-07 DIAGNOSIS — M6281 Muscle weakness (generalized): Secondary | ICD-10-CM | POA: Diagnosis not present

## 2015-11-07 DIAGNOSIS — G20 Parkinson's disease: Secondary | ICD-10-CM | POA: Diagnosis not present

## 2015-11-07 DIAGNOSIS — L89150 Pressure ulcer of sacral region, unstageable: Secondary | ICD-10-CM | POA: Diagnosis not present

## 2015-11-07 DIAGNOSIS — I951 Orthostatic hypotension: Secondary | ICD-10-CM | POA: Diagnosis not present

## 2015-11-07 DIAGNOSIS — I10 Essential (primary) hypertension: Secondary | ICD-10-CM | POA: Diagnosis not present

## 2015-11-07 DIAGNOSIS — R627 Adult failure to thrive: Secondary | ICD-10-CM | POA: Diagnosis not present

## 2015-11-08 DIAGNOSIS — G20 Parkinson's disease: Secondary | ICD-10-CM | POA: Diagnosis not present

## 2015-11-08 DIAGNOSIS — I951 Orthostatic hypotension: Secondary | ICD-10-CM | POA: Diagnosis not present

## 2015-11-08 DIAGNOSIS — L89150 Pressure ulcer of sacral region, unstageable: Secondary | ICD-10-CM | POA: Diagnosis not present

## 2015-11-08 DIAGNOSIS — I10 Essential (primary) hypertension: Secondary | ICD-10-CM | POA: Diagnosis not present

## 2015-11-08 DIAGNOSIS — R627 Adult failure to thrive: Secondary | ICD-10-CM | POA: Diagnosis not present

## 2015-11-08 DIAGNOSIS — M6281 Muscle weakness (generalized): Secondary | ICD-10-CM | POA: Diagnosis not present

## 2015-11-11 DIAGNOSIS — I10 Essential (primary) hypertension: Secondary | ICD-10-CM | POA: Diagnosis not present

## 2015-11-11 DIAGNOSIS — I951 Orthostatic hypotension: Secondary | ICD-10-CM | POA: Diagnosis not present

## 2015-11-11 DIAGNOSIS — M6281 Muscle weakness (generalized): Secondary | ICD-10-CM | POA: Diagnosis not present

## 2015-11-11 DIAGNOSIS — R627 Adult failure to thrive: Secondary | ICD-10-CM | POA: Diagnosis not present

## 2015-11-11 DIAGNOSIS — L89150 Pressure ulcer of sacral region, unstageable: Secondary | ICD-10-CM | POA: Diagnosis not present

## 2015-11-11 DIAGNOSIS — G20 Parkinson's disease: Secondary | ICD-10-CM | POA: Diagnosis not present

## 2015-11-12 DIAGNOSIS — I10 Essential (primary) hypertension: Secondary | ICD-10-CM | POA: Diagnosis not present

## 2015-11-12 DIAGNOSIS — L89150 Pressure ulcer of sacral region, unstageable: Secondary | ICD-10-CM | POA: Diagnosis not present

## 2015-11-12 DIAGNOSIS — I951 Orthostatic hypotension: Secondary | ICD-10-CM | POA: Diagnosis not present

## 2015-11-12 DIAGNOSIS — R627 Adult failure to thrive: Secondary | ICD-10-CM | POA: Diagnosis not present

## 2015-11-12 DIAGNOSIS — M6281 Muscle weakness (generalized): Secondary | ICD-10-CM | POA: Diagnosis not present

## 2015-11-12 DIAGNOSIS — G20 Parkinson's disease: Secondary | ICD-10-CM | POA: Diagnosis not present

## 2015-11-13 DIAGNOSIS — I951 Orthostatic hypotension: Secondary | ICD-10-CM | POA: Diagnosis not present

## 2015-11-13 DIAGNOSIS — M6281 Muscle weakness (generalized): Secondary | ICD-10-CM | POA: Diagnosis not present

## 2015-11-13 DIAGNOSIS — R627 Adult failure to thrive: Secondary | ICD-10-CM | POA: Diagnosis not present

## 2015-11-13 DIAGNOSIS — L89150 Pressure ulcer of sacral region, unstageable: Secondary | ICD-10-CM | POA: Diagnosis not present

## 2015-11-13 DIAGNOSIS — G20 Parkinson's disease: Secondary | ICD-10-CM | POA: Diagnosis not present

## 2015-11-13 DIAGNOSIS — I10 Essential (primary) hypertension: Secondary | ICD-10-CM | POA: Diagnosis not present

## 2015-11-14 DIAGNOSIS — I951 Orthostatic hypotension: Secondary | ICD-10-CM | POA: Diagnosis not present

## 2015-11-14 DIAGNOSIS — I10 Essential (primary) hypertension: Secondary | ICD-10-CM | POA: Diagnosis not present

## 2015-11-14 DIAGNOSIS — M6281 Muscle weakness (generalized): Secondary | ICD-10-CM | POA: Diagnosis not present

## 2015-11-14 DIAGNOSIS — L89150 Pressure ulcer of sacral region, unstageable: Secondary | ICD-10-CM | POA: Diagnosis not present

## 2015-11-14 DIAGNOSIS — G20 Parkinson's disease: Secondary | ICD-10-CM | POA: Diagnosis not present

## 2015-11-14 DIAGNOSIS — R627 Adult failure to thrive: Secondary | ICD-10-CM | POA: Diagnosis not present

## 2015-11-17 DIAGNOSIS — G20 Parkinson's disease: Secondary | ICD-10-CM | POA: Diagnosis not present

## 2015-11-17 DIAGNOSIS — F331 Major depressive disorder, recurrent, moderate: Secondary | ICD-10-CM | POA: Diagnosis not present

## 2015-11-17 DIAGNOSIS — I951 Orthostatic hypotension: Secondary | ICD-10-CM | POA: Diagnosis not present

## 2015-11-17 DIAGNOSIS — K219 Gastro-esophageal reflux disease without esophagitis: Secondary | ICD-10-CM | POA: Diagnosis not present

## 2015-11-19 DIAGNOSIS — I951 Orthostatic hypotension: Secondary | ICD-10-CM | POA: Diagnosis not present

## 2015-11-19 DIAGNOSIS — M6281 Muscle weakness (generalized): Secondary | ICD-10-CM | POA: Diagnosis not present

## 2015-11-19 DIAGNOSIS — R627 Adult failure to thrive: Secondary | ICD-10-CM | POA: Diagnosis not present

## 2015-11-19 DIAGNOSIS — I10 Essential (primary) hypertension: Secondary | ICD-10-CM | POA: Diagnosis not present

## 2015-11-19 DIAGNOSIS — G20 Parkinson's disease: Secondary | ICD-10-CM | POA: Diagnosis not present

## 2015-11-19 DIAGNOSIS — L89150 Pressure ulcer of sacral region, unstageable: Secondary | ICD-10-CM | POA: Diagnosis not present

## 2015-11-20 DIAGNOSIS — L89150 Pressure ulcer of sacral region, unstageable: Secondary | ICD-10-CM | POA: Diagnosis not present

## 2015-11-20 DIAGNOSIS — I10 Essential (primary) hypertension: Secondary | ICD-10-CM | POA: Diagnosis not present

## 2015-11-20 DIAGNOSIS — I951 Orthostatic hypotension: Secondary | ICD-10-CM | POA: Diagnosis not present

## 2015-11-20 DIAGNOSIS — M6281 Muscle weakness (generalized): Secondary | ICD-10-CM | POA: Diagnosis not present

## 2015-11-20 DIAGNOSIS — G20 Parkinson's disease: Secondary | ICD-10-CM | POA: Diagnosis not present

## 2015-11-20 DIAGNOSIS — R627 Adult failure to thrive: Secondary | ICD-10-CM | POA: Diagnosis not present

## 2015-11-21 DIAGNOSIS — M6281 Muscle weakness (generalized): Secondary | ICD-10-CM | POA: Diagnosis not present

## 2015-11-21 DIAGNOSIS — R627 Adult failure to thrive: Secondary | ICD-10-CM | POA: Diagnosis not present

## 2015-11-21 DIAGNOSIS — L89150 Pressure ulcer of sacral region, unstageable: Secondary | ICD-10-CM | POA: Diagnosis not present

## 2015-11-21 DIAGNOSIS — G20 Parkinson's disease: Secondary | ICD-10-CM | POA: Diagnosis not present

## 2015-11-21 DIAGNOSIS — G609 Hereditary and idiopathic neuropathy, unspecified: Secondary | ICD-10-CM | POA: Diagnosis not present

## 2015-11-21 DIAGNOSIS — R2689 Other abnormalities of gait and mobility: Secondary | ICD-10-CM | POA: Diagnosis not present

## 2015-11-21 DIAGNOSIS — I10 Essential (primary) hypertension: Secondary | ICD-10-CM | POA: Diagnosis not present

## 2015-11-21 DIAGNOSIS — I951 Orthostatic hypotension: Secondary | ICD-10-CM | POA: Diagnosis not present

## 2015-11-21 DIAGNOSIS — W19XXXA Unspecified fall, initial encounter: Secondary | ICD-10-CM | POA: Diagnosis not present

## 2015-11-26 DIAGNOSIS — I951 Orthostatic hypotension: Secondary | ICD-10-CM | POA: Diagnosis not present

## 2015-11-26 DIAGNOSIS — I10 Essential (primary) hypertension: Secondary | ICD-10-CM | POA: Diagnosis not present

## 2015-11-26 DIAGNOSIS — R627 Adult failure to thrive: Secondary | ICD-10-CM | POA: Diagnosis not present

## 2015-11-26 DIAGNOSIS — G20 Parkinson's disease: Secondary | ICD-10-CM | POA: Diagnosis not present

## 2015-11-26 DIAGNOSIS — L89150 Pressure ulcer of sacral region, unstageable: Secondary | ICD-10-CM | POA: Diagnosis not present

## 2015-11-26 DIAGNOSIS — M6281 Muscle weakness (generalized): Secondary | ICD-10-CM | POA: Diagnosis not present

## 2015-11-28 DIAGNOSIS — G20 Parkinson's disease: Secondary | ICD-10-CM | POA: Diagnosis not present

## 2015-11-28 DIAGNOSIS — M6281 Muscle weakness (generalized): Secondary | ICD-10-CM | POA: Diagnosis not present

## 2015-11-28 DIAGNOSIS — I951 Orthostatic hypotension: Secondary | ICD-10-CM | POA: Diagnosis not present

## 2015-11-28 DIAGNOSIS — L89150 Pressure ulcer of sacral region, unstageable: Secondary | ICD-10-CM | POA: Diagnosis not present

## 2015-11-28 DIAGNOSIS — I10 Essential (primary) hypertension: Secondary | ICD-10-CM | POA: Diagnosis not present

## 2015-11-28 DIAGNOSIS — R627 Adult failure to thrive: Secondary | ICD-10-CM | POA: Diagnosis not present

## 2015-11-29 DIAGNOSIS — Z880 Allergy status to penicillin: Secondary | ICD-10-CM | POA: Diagnosis not present

## 2015-11-29 DIAGNOSIS — Z79899 Other long term (current) drug therapy: Secondary | ICD-10-CM | POA: Diagnosis not present

## 2015-11-29 DIAGNOSIS — L89159 Pressure ulcer of sacral region, unspecified stage: Secondary | ICD-10-CM | POA: Diagnosis not present

## 2015-11-29 DIAGNOSIS — G20 Parkinson's disease: Secondary | ICD-10-CM | POA: Diagnosis not present

## 2015-11-29 DIAGNOSIS — L03317 Cellulitis of buttock: Secondary | ICD-10-CM | POA: Diagnosis not present

## 2015-12-02 DIAGNOSIS — L89150 Pressure ulcer of sacral region, unstageable: Secondary | ICD-10-CM | POA: Diagnosis not present

## 2015-12-02 DIAGNOSIS — G20 Parkinson's disease: Secondary | ICD-10-CM | POA: Diagnosis not present

## 2015-12-02 DIAGNOSIS — I10 Essential (primary) hypertension: Secondary | ICD-10-CM | POA: Diagnosis not present

## 2015-12-02 DIAGNOSIS — M6281 Muscle weakness (generalized): Secondary | ICD-10-CM | POA: Diagnosis not present

## 2015-12-02 DIAGNOSIS — I951 Orthostatic hypotension: Secondary | ICD-10-CM | POA: Diagnosis not present

## 2015-12-02 DIAGNOSIS — R627 Adult failure to thrive: Secondary | ICD-10-CM | POA: Diagnosis not present

## 2015-12-03 DIAGNOSIS — I10 Essential (primary) hypertension: Secondary | ICD-10-CM | POA: Diagnosis not present

## 2015-12-03 DIAGNOSIS — I951 Orthostatic hypotension: Secondary | ICD-10-CM | POA: Diagnosis not present

## 2015-12-03 DIAGNOSIS — L89150 Pressure ulcer of sacral region, unstageable: Secondary | ICD-10-CM | POA: Diagnosis not present

## 2015-12-03 DIAGNOSIS — R627 Adult failure to thrive: Secondary | ICD-10-CM | POA: Diagnosis not present

## 2015-12-03 DIAGNOSIS — G20 Parkinson's disease: Secondary | ICD-10-CM | POA: Diagnosis not present

## 2015-12-03 DIAGNOSIS — M6281 Muscle weakness (generalized): Secondary | ICD-10-CM | POA: Diagnosis not present

## 2015-12-04 DIAGNOSIS — I10 Essential (primary) hypertension: Secondary | ICD-10-CM | POA: Diagnosis not present

## 2015-12-04 DIAGNOSIS — I951 Orthostatic hypotension: Secondary | ICD-10-CM | POA: Diagnosis not present

## 2015-12-04 DIAGNOSIS — L89150 Pressure ulcer of sacral region, unstageable: Secondary | ICD-10-CM | POA: Diagnosis not present

## 2015-12-04 DIAGNOSIS — R627 Adult failure to thrive: Secondary | ICD-10-CM | POA: Diagnosis not present

## 2015-12-04 DIAGNOSIS — M6281 Muscle weakness (generalized): Secondary | ICD-10-CM | POA: Diagnosis not present

## 2015-12-04 DIAGNOSIS — G20 Parkinson's disease: Secondary | ICD-10-CM | POA: Diagnosis not present

## 2015-12-05 DIAGNOSIS — L89150 Pressure ulcer of sacral region, unstageable: Secondary | ICD-10-CM | POA: Diagnosis not present

## 2015-12-05 DIAGNOSIS — I10 Essential (primary) hypertension: Secondary | ICD-10-CM | POA: Diagnosis not present

## 2015-12-05 DIAGNOSIS — R627 Adult failure to thrive: Secondary | ICD-10-CM | POA: Diagnosis not present

## 2015-12-05 DIAGNOSIS — G20 Parkinson's disease: Secondary | ICD-10-CM | POA: Diagnosis not present

## 2015-12-05 DIAGNOSIS — M6281 Muscle weakness (generalized): Secondary | ICD-10-CM | POA: Diagnosis not present

## 2015-12-05 DIAGNOSIS — I951 Orthostatic hypotension: Secondary | ICD-10-CM | POA: Diagnosis not present

## 2015-12-09 DIAGNOSIS — G20 Parkinson's disease: Secondary | ICD-10-CM | POA: Diagnosis not present

## 2015-12-09 DIAGNOSIS — I951 Orthostatic hypotension: Secondary | ICD-10-CM | POA: Diagnosis not present

## 2015-12-09 DIAGNOSIS — L89150 Pressure ulcer of sacral region, unstageable: Secondary | ICD-10-CM | POA: Diagnosis not present

## 2015-12-09 DIAGNOSIS — R627 Adult failure to thrive: Secondary | ICD-10-CM | POA: Diagnosis not present

## 2015-12-09 DIAGNOSIS — M6281 Muscle weakness (generalized): Secondary | ICD-10-CM | POA: Diagnosis not present

## 2015-12-09 DIAGNOSIS — I10 Essential (primary) hypertension: Secondary | ICD-10-CM | POA: Diagnosis not present

## 2015-12-10 DIAGNOSIS — L89150 Pressure ulcer of sacral region, unstageable: Secondary | ICD-10-CM | POA: Diagnosis not present

## 2015-12-10 DIAGNOSIS — I10 Essential (primary) hypertension: Secondary | ICD-10-CM | POA: Diagnosis not present

## 2015-12-10 DIAGNOSIS — I951 Orthostatic hypotension: Secondary | ICD-10-CM | POA: Diagnosis not present

## 2015-12-10 DIAGNOSIS — G20 Parkinson's disease: Secondary | ICD-10-CM | POA: Diagnosis not present

## 2015-12-10 DIAGNOSIS — M6281 Muscle weakness (generalized): Secondary | ICD-10-CM | POA: Diagnosis not present

## 2015-12-10 DIAGNOSIS — R627 Adult failure to thrive: Secondary | ICD-10-CM | POA: Diagnosis not present

## 2015-12-13 DIAGNOSIS — I10 Essential (primary) hypertension: Secondary | ICD-10-CM | POA: Diagnosis not present

## 2015-12-13 DIAGNOSIS — I951 Orthostatic hypotension: Secondary | ICD-10-CM | POA: Diagnosis not present

## 2015-12-13 DIAGNOSIS — M6281 Muscle weakness (generalized): Secondary | ICD-10-CM | POA: Diagnosis not present

## 2015-12-13 DIAGNOSIS — G20 Parkinson's disease: Secondary | ICD-10-CM | POA: Diagnosis not present

## 2015-12-13 DIAGNOSIS — R627 Adult failure to thrive: Secondary | ICD-10-CM | POA: Diagnosis not present

## 2015-12-13 DIAGNOSIS — L89150 Pressure ulcer of sacral region, unstageable: Secondary | ICD-10-CM | POA: Diagnosis not present

## 2015-12-16 DIAGNOSIS — M6281 Muscle weakness (generalized): Secondary | ICD-10-CM | POA: Diagnosis not present

## 2015-12-16 DIAGNOSIS — G20 Parkinson's disease: Secondary | ICD-10-CM | POA: Diagnosis not present

## 2015-12-16 DIAGNOSIS — I10 Essential (primary) hypertension: Secondary | ICD-10-CM | POA: Diagnosis not present

## 2015-12-16 DIAGNOSIS — R627 Adult failure to thrive: Secondary | ICD-10-CM | POA: Diagnosis not present

## 2015-12-16 DIAGNOSIS — L89150 Pressure ulcer of sacral region, unstageable: Secondary | ICD-10-CM | POA: Diagnosis not present

## 2015-12-16 DIAGNOSIS — I951 Orthostatic hypotension: Secondary | ICD-10-CM | POA: Diagnosis not present

## 2015-12-17 DIAGNOSIS — G20 Parkinson's disease: Secondary | ICD-10-CM | POA: Diagnosis not present

## 2015-12-17 DIAGNOSIS — K219 Gastro-esophageal reflux disease without esophagitis: Secondary | ICD-10-CM | POA: Diagnosis not present

## 2015-12-17 DIAGNOSIS — I951 Orthostatic hypotension: Secondary | ICD-10-CM | POA: Diagnosis not present

## 2015-12-17 DIAGNOSIS — E782 Mixed hyperlipidemia: Secondary | ICD-10-CM | POA: Diagnosis not present

## 2015-12-17 DIAGNOSIS — F331 Major depressive disorder, recurrent, moderate: Secondary | ICD-10-CM | POA: Diagnosis not present

## 2015-12-20 DIAGNOSIS — E782 Mixed hyperlipidemia: Secondary | ICD-10-CM | POA: Diagnosis not present

## 2015-12-20 DIAGNOSIS — G20 Parkinson's disease: Secondary | ICD-10-CM | POA: Diagnosis not present

## 2015-12-20 DIAGNOSIS — F331 Major depressive disorder, recurrent, moderate: Secondary | ICD-10-CM | POA: Diagnosis not present

## 2015-12-20 DIAGNOSIS — K219 Gastro-esophageal reflux disease without esophagitis: Secondary | ICD-10-CM | POA: Diagnosis not present

## 2015-12-20 DIAGNOSIS — M545 Low back pain: Secondary | ICD-10-CM | POA: Diagnosis not present

## 2016-01-02 ENCOUNTER — Ambulatory Visit: Payer: Medicare HMO | Admitting: Podiatry

## 2016-01-20 DIAGNOSIS — E663 Overweight: Secondary | ICD-10-CM | POA: Diagnosis not present

## 2016-01-20 DIAGNOSIS — K219 Gastro-esophageal reflux disease without esophagitis: Secondary | ICD-10-CM | POA: Diagnosis not present

## 2016-01-20 DIAGNOSIS — K5900 Constipation, unspecified: Secondary | ICD-10-CM | POA: Diagnosis not present

## 2016-01-20 DIAGNOSIS — E785 Hyperlipidemia, unspecified: Secondary | ICD-10-CM | POA: Diagnosis not present

## 2016-01-20 DIAGNOSIS — N183 Chronic kidney disease, stage 3 (moderate): Secondary | ICD-10-CM | POA: Diagnosis not present

## 2016-01-20 DIAGNOSIS — G20 Parkinson's disease: Secondary | ICD-10-CM | POA: Diagnosis not present

## 2016-01-20 DIAGNOSIS — M810 Age-related osteoporosis without current pathological fracture: Secondary | ICD-10-CM | POA: Diagnosis not present

## 2016-01-20 DIAGNOSIS — M47819 Spondylosis without myelopathy or radiculopathy, site unspecified: Secondary | ICD-10-CM | POA: Diagnosis not present

## 2016-01-20 DIAGNOSIS — M160 Bilateral primary osteoarthritis of hip: Secondary | ICD-10-CM | POA: Diagnosis not present

## 2016-01-21 DIAGNOSIS — G20 Parkinson's disease: Secondary | ICD-10-CM | POA: Diagnosis not present

## 2016-01-21 DIAGNOSIS — R2689 Other abnormalities of gait and mobility: Secondary | ICD-10-CM | POA: Diagnosis not present

## 2016-01-21 DIAGNOSIS — G609 Hereditary and idiopathic neuropathy, unspecified: Secondary | ICD-10-CM | POA: Diagnosis not present

## 2016-01-21 DIAGNOSIS — M6281 Muscle weakness (generalized): Secondary | ICD-10-CM | POA: Diagnosis not present

## 2016-01-21 DIAGNOSIS — W19XXXA Unspecified fall, initial encounter: Secondary | ICD-10-CM | POA: Diagnosis not present

## 2016-02-04 DIAGNOSIS — L500 Allergic urticaria: Secondary | ICD-10-CM | POA: Diagnosis not present

## 2016-02-04 DIAGNOSIS — G20 Parkinson's disease: Secondary | ICD-10-CM | POA: Diagnosis not present

## 2016-02-04 DIAGNOSIS — R079 Chest pain, unspecified: Secondary | ICD-10-CM | POA: Diagnosis not present

## 2016-02-04 DIAGNOSIS — Z79899 Other long term (current) drug therapy: Secondary | ICD-10-CM | POA: Diagnosis not present

## 2016-02-04 DIAGNOSIS — T7840XA Allergy, unspecified, initial encounter: Secondary | ICD-10-CM | POA: Diagnosis not present

## 2016-02-04 DIAGNOSIS — R0789 Other chest pain: Secondary | ICD-10-CM | POA: Diagnosis not present

## 2016-02-21 DIAGNOSIS — W19XXXA Unspecified fall, initial encounter: Secondary | ICD-10-CM | POA: Diagnosis not present

## 2016-02-21 DIAGNOSIS — G20 Parkinson's disease: Secondary | ICD-10-CM | POA: Diagnosis not present

## 2016-02-21 DIAGNOSIS — G609 Hereditary and idiopathic neuropathy, unspecified: Secondary | ICD-10-CM | POA: Diagnosis not present

## 2016-02-21 DIAGNOSIS — R2689 Other abnormalities of gait and mobility: Secondary | ICD-10-CM | POA: Diagnosis not present

## 2016-02-21 DIAGNOSIS — M6281 Muscle weakness (generalized): Secondary | ICD-10-CM | POA: Diagnosis not present

## 2016-03-01 DIAGNOSIS — K2970 Gastritis, unspecified, without bleeding: Secondary | ICD-10-CM | POA: Diagnosis not present

## 2016-03-01 DIAGNOSIS — R1111 Vomiting without nausea: Secondary | ICD-10-CM | POA: Diagnosis not present

## 2016-03-01 DIAGNOSIS — K8064 Calculus of gallbladder and bile duct with chronic cholecystitis without obstruction: Secondary | ICD-10-CM | POA: Diagnosis not present

## 2016-03-01 DIAGNOSIS — R918 Other nonspecific abnormal finding of lung field: Secondary | ICD-10-CM | POA: Diagnosis not present

## 2016-03-01 DIAGNOSIS — K8000 Calculus of gallbladder with acute cholecystitis without obstruction: Secondary | ICD-10-CM | POA: Diagnosis not present

## 2016-03-01 DIAGNOSIS — J189 Pneumonia, unspecified organism: Secondary | ICD-10-CM | POA: Diagnosis not present

## 2016-03-01 DIAGNOSIS — R079 Chest pain, unspecified: Secondary | ICD-10-CM | POA: Diagnosis not present

## 2016-03-01 DIAGNOSIS — B961 Klebsiella pneumoniae [K. pneumoniae] as the cause of diseases classified elsewhere: Secondary | ICD-10-CM | POA: Diagnosis not present

## 2016-03-01 DIAGNOSIS — J690 Pneumonitis due to inhalation of food and vomit: Secondary | ICD-10-CM | POA: Diagnosis not present

## 2016-03-01 DIAGNOSIS — M6281 Muscle weakness (generalized): Secondary | ICD-10-CM | POA: Diagnosis not present

## 2016-03-01 DIAGNOSIS — I2510 Atherosclerotic heart disease of native coronary artery without angina pectoris: Secondary | ICD-10-CM | POA: Diagnosis not present

## 2016-03-01 DIAGNOSIS — R945 Abnormal results of liver function studies: Secondary | ICD-10-CM | POA: Diagnosis not present

## 2016-03-01 DIAGNOSIS — K810 Acute cholecystitis: Secondary | ICD-10-CM | POA: Diagnosis not present

## 2016-03-01 DIAGNOSIS — K8063 Calculus of gallbladder and bile duct with acute cholecystitis with obstruction: Secondary | ICD-10-CM | POA: Diagnosis not present

## 2016-03-01 DIAGNOSIS — K8020 Calculus of gallbladder without cholecystitis without obstruction: Secondary | ICD-10-CM | POA: Diagnosis not present

## 2016-03-01 DIAGNOSIS — I700 Atherosclerosis of aorta: Secondary | ICD-10-CM | POA: Diagnosis not present

## 2016-03-01 DIAGNOSIS — K838 Other specified diseases of biliary tract: Secondary | ICD-10-CM | POA: Diagnosis not present

## 2016-03-01 DIAGNOSIS — K828 Other specified diseases of gallbladder: Secondary | ICD-10-CM | POA: Diagnosis not present

## 2016-03-01 DIAGNOSIS — R7881 Bacteremia: Secondary | ICD-10-CM | POA: Diagnosis not present

## 2016-03-01 DIAGNOSIS — K8080 Other cholelithiasis without obstruction: Secondary | ICD-10-CM | POA: Diagnosis not present

## 2016-03-01 DIAGNOSIS — R609 Edema, unspecified: Secondary | ICD-10-CM | POA: Diagnosis not present

## 2016-03-01 DIAGNOSIS — I350 Nonrheumatic aortic (valve) stenosis: Secondary | ICD-10-CM | POA: Diagnosis not present

## 2016-03-01 DIAGNOSIS — D649 Anemia, unspecified: Secondary | ICD-10-CM | POA: Diagnosis not present

## 2016-03-01 DIAGNOSIS — G20 Parkinson's disease: Secondary | ICD-10-CM | POA: Diagnosis not present

## 2016-03-01 DIAGNOSIS — R2689 Other abnormalities of gait and mobility: Secondary | ICD-10-CM | POA: Diagnosis not present

## 2016-03-01 DIAGNOSIS — E785 Hyperlipidemia, unspecified: Secondary | ICD-10-CM | POA: Diagnosis not present

## 2016-03-01 DIAGNOSIS — R911 Solitary pulmonary nodule: Secondary | ICD-10-CM | POA: Diagnosis not present

## 2016-03-01 DIAGNOSIS — R935 Abnormal findings on diagnostic imaging of other abdominal regions, including retroperitoneum: Secondary | ICD-10-CM | POA: Diagnosis not present

## 2016-03-10 DIAGNOSIS — K8080 Other cholelithiasis without obstruction: Secondary | ICD-10-CM | POA: Diagnosis not present

## 2016-03-10 DIAGNOSIS — E785 Hyperlipidemia, unspecified: Secondary | ICD-10-CM | POA: Diagnosis not present

## 2016-03-10 DIAGNOSIS — G20 Parkinson's disease: Secondary | ICD-10-CM | POA: Diagnosis not present

## 2016-03-10 DIAGNOSIS — K8020 Calculus of gallbladder without cholecystitis without obstruction: Secondary | ICD-10-CM | POA: Diagnosis not present

## 2016-03-13 DIAGNOSIS — M6281 Muscle weakness (generalized): Secondary | ICD-10-CM | POA: Diagnosis not present

## 2016-03-13 DIAGNOSIS — W19XXXA Unspecified fall, initial encounter: Secondary | ICD-10-CM | POA: Diagnosis not present

## 2016-03-13 DIAGNOSIS — K8000 Calculus of gallbladder with acute cholecystitis without obstruction: Secondary | ICD-10-CM | POA: Diagnosis not present

## 2016-03-13 DIAGNOSIS — G609 Hereditary and idiopathic neuropathy, unspecified: Secondary | ICD-10-CM | POA: Diagnosis not present

## 2016-03-13 DIAGNOSIS — I350 Nonrheumatic aortic (valve) stenosis: Secondary | ICD-10-CM | POA: Diagnosis not present

## 2016-03-13 DIAGNOSIS — G20 Parkinson's disease: Secondary | ICD-10-CM | POA: Diagnosis not present

## 2016-03-13 DIAGNOSIS — R609 Edema, unspecified: Secondary | ICD-10-CM | POA: Diagnosis not present

## 2016-03-13 DIAGNOSIS — K8063 Calculus of gallbladder and bile duct with acute cholecystitis with obstruction: Secondary | ICD-10-CM | POA: Diagnosis not present

## 2016-03-13 DIAGNOSIS — I700 Atherosclerosis of aorta: Secondary | ICD-10-CM | POA: Diagnosis not present

## 2016-03-13 DIAGNOSIS — R2689 Other abnormalities of gait and mobility: Secondary | ICD-10-CM | POA: Diagnosis not present

## 2016-03-13 DIAGNOSIS — J690 Pneumonitis due to inhalation of food and vomit: Secondary | ICD-10-CM | POA: Diagnosis not present

## 2016-03-13 DIAGNOSIS — I2510 Atherosclerotic heart disease of native coronary artery without angina pectoris: Secondary | ICD-10-CM | POA: Diagnosis not present

## 2016-03-14 DIAGNOSIS — J690 Pneumonitis due to inhalation of food and vomit: Secondary | ICD-10-CM | POA: Diagnosis not present

## 2016-03-14 DIAGNOSIS — K8063 Calculus of gallbladder and bile duct with acute cholecystitis with obstruction: Secondary | ICD-10-CM | POA: Diagnosis not present

## 2016-03-14 DIAGNOSIS — G20 Parkinson's disease: Secondary | ICD-10-CM | POA: Diagnosis not present

## 2016-03-27 DIAGNOSIS — M479 Spondylosis, unspecified: Secondary | ICD-10-CM | POA: Diagnosis not present

## 2016-03-27 DIAGNOSIS — M519 Unspecified thoracic, thoracolumbar and lumbosacral intervertebral disc disorder: Secondary | ICD-10-CM | POA: Diagnosis not present

## 2016-03-27 DIAGNOSIS — G20 Parkinson's disease: Secondary | ICD-10-CM | POA: Diagnosis not present

## 2016-03-27 DIAGNOSIS — G629 Polyneuropathy, unspecified: Secondary | ICD-10-CM | POA: Diagnosis not present

## 2016-03-27 DIAGNOSIS — G8929 Other chronic pain: Secondary | ICD-10-CM | POA: Diagnosis not present

## 2016-03-27 DIAGNOSIS — Z48815 Encounter for surgical aftercare following surgery on the digestive system: Secondary | ICD-10-CM | POA: Diagnosis not present

## 2016-03-30 DIAGNOSIS — G8929 Other chronic pain: Secondary | ICD-10-CM | POA: Diagnosis not present

## 2016-03-30 DIAGNOSIS — G629 Polyneuropathy, unspecified: Secondary | ICD-10-CM | POA: Diagnosis not present

## 2016-03-30 DIAGNOSIS — M479 Spondylosis, unspecified: Secondary | ICD-10-CM | POA: Diagnosis not present

## 2016-03-30 DIAGNOSIS — G20 Parkinson's disease: Secondary | ICD-10-CM | POA: Diagnosis not present

## 2016-03-30 DIAGNOSIS — M519 Unspecified thoracic, thoracolumbar and lumbosacral intervertebral disc disorder: Secondary | ICD-10-CM | POA: Diagnosis not present

## 2016-03-30 DIAGNOSIS — Z48815 Encounter for surgical aftercare following surgery on the digestive system: Secondary | ICD-10-CM | POA: Diagnosis not present

## 2016-03-31 DIAGNOSIS — M519 Unspecified thoracic, thoracolumbar and lumbosacral intervertebral disc disorder: Secondary | ICD-10-CM | POA: Diagnosis not present

## 2016-03-31 DIAGNOSIS — G629 Polyneuropathy, unspecified: Secondary | ICD-10-CM | POA: Diagnosis not present

## 2016-03-31 DIAGNOSIS — M479 Spondylosis, unspecified: Secondary | ICD-10-CM | POA: Diagnosis not present

## 2016-03-31 DIAGNOSIS — G20 Parkinson's disease: Secondary | ICD-10-CM | POA: Diagnosis not present

## 2016-03-31 DIAGNOSIS — G8929 Other chronic pain: Secondary | ICD-10-CM | POA: Diagnosis not present

## 2016-03-31 DIAGNOSIS — Z48815 Encounter for surgical aftercare following surgery on the digestive system: Secondary | ICD-10-CM | POA: Diagnosis not present

## 2016-04-01 DIAGNOSIS — M479 Spondylosis, unspecified: Secondary | ICD-10-CM | POA: Diagnosis not present

## 2016-04-01 DIAGNOSIS — M519 Unspecified thoracic, thoracolumbar and lumbosacral intervertebral disc disorder: Secondary | ICD-10-CM | POA: Diagnosis not present

## 2016-04-01 DIAGNOSIS — G8929 Other chronic pain: Secondary | ICD-10-CM | POA: Diagnosis not present

## 2016-04-01 DIAGNOSIS — G20 Parkinson's disease: Secondary | ICD-10-CM | POA: Diagnosis not present

## 2016-04-01 DIAGNOSIS — Z48815 Encounter for surgical aftercare following surgery on the digestive system: Secondary | ICD-10-CM | POA: Diagnosis not present

## 2016-04-01 DIAGNOSIS — G629 Polyneuropathy, unspecified: Secondary | ICD-10-CM | POA: Diagnosis not present

## 2016-04-01 DIAGNOSIS — Z23 Encounter for immunization: Secondary | ICD-10-CM | POA: Diagnosis not present

## 2016-04-02 DIAGNOSIS — M479 Spondylosis, unspecified: Secondary | ICD-10-CM | POA: Diagnosis not present

## 2016-04-02 DIAGNOSIS — G8929 Other chronic pain: Secondary | ICD-10-CM | POA: Diagnosis not present

## 2016-04-02 DIAGNOSIS — M519 Unspecified thoracic, thoracolumbar and lumbosacral intervertebral disc disorder: Secondary | ICD-10-CM | POA: Diagnosis not present

## 2016-04-02 DIAGNOSIS — Z48815 Encounter for surgical aftercare following surgery on the digestive system: Secondary | ICD-10-CM | POA: Diagnosis not present

## 2016-04-02 DIAGNOSIS — G629 Polyneuropathy, unspecified: Secondary | ICD-10-CM | POA: Diagnosis not present

## 2016-04-02 DIAGNOSIS — G20 Parkinson's disease: Secondary | ICD-10-CM | POA: Diagnosis not present

## 2016-04-07 DIAGNOSIS — G20 Parkinson's disease: Secondary | ICD-10-CM | POA: Diagnosis not present

## 2016-04-07 DIAGNOSIS — G629 Polyneuropathy, unspecified: Secondary | ICD-10-CM | POA: Diagnosis not present

## 2016-04-07 DIAGNOSIS — M519 Unspecified thoracic, thoracolumbar and lumbosacral intervertebral disc disorder: Secondary | ICD-10-CM | POA: Diagnosis not present

## 2016-04-07 DIAGNOSIS — G8929 Other chronic pain: Secondary | ICD-10-CM | POA: Diagnosis not present

## 2016-04-07 DIAGNOSIS — M479 Spondylosis, unspecified: Secondary | ICD-10-CM | POA: Diagnosis not present

## 2016-04-07 DIAGNOSIS — Z48815 Encounter for surgical aftercare following surgery on the digestive system: Secondary | ICD-10-CM | POA: Diagnosis not present

## 2016-04-08 DIAGNOSIS — G629 Polyneuropathy, unspecified: Secondary | ICD-10-CM | POA: Diagnosis not present

## 2016-04-08 DIAGNOSIS — Z48815 Encounter for surgical aftercare following surgery on the digestive system: Secondary | ICD-10-CM | POA: Diagnosis not present

## 2016-04-08 DIAGNOSIS — M519 Unspecified thoracic, thoracolumbar and lumbosacral intervertebral disc disorder: Secondary | ICD-10-CM | POA: Diagnosis not present

## 2016-04-08 DIAGNOSIS — G20 Parkinson's disease: Secondary | ICD-10-CM | POA: Diagnosis not present

## 2016-04-08 DIAGNOSIS — M479 Spondylosis, unspecified: Secondary | ICD-10-CM | POA: Diagnosis not present

## 2016-04-08 DIAGNOSIS — G8929 Other chronic pain: Secondary | ICD-10-CM | POA: Diagnosis not present

## 2016-04-09 DIAGNOSIS — M479 Spondylosis, unspecified: Secondary | ICD-10-CM | POA: Diagnosis not present

## 2016-04-09 DIAGNOSIS — G629 Polyneuropathy, unspecified: Secondary | ICD-10-CM | POA: Diagnosis not present

## 2016-04-09 DIAGNOSIS — Z48815 Encounter for surgical aftercare following surgery on the digestive system: Secondary | ICD-10-CM | POA: Diagnosis not present

## 2016-04-09 DIAGNOSIS — G8929 Other chronic pain: Secondary | ICD-10-CM | POA: Diagnosis not present

## 2016-04-09 DIAGNOSIS — G20 Parkinson's disease: Secondary | ICD-10-CM | POA: Diagnosis not present

## 2016-04-09 DIAGNOSIS — M519 Unspecified thoracic, thoracolumbar and lumbosacral intervertebral disc disorder: Secondary | ICD-10-CM | POA: Diagnosis not present

## 2016-04-14 DIAGNOSIS — G629 Polyneuropathy, unspecified: Secondary | ICD-10-CM | POA: Diagnosis not present

## 2016-04-14 DIAGNOSIS — M519 Unspecified thoracic, thoracolumbar and lumbosacral intervertebral disc disorder: Secondary | ICD-10-CM | POA: Diagnosis not present

## 2016-04-14 DIAGNOSIS — G8929 Other chronic pain: Secondary | ICD-10-CM | POA: Diagnosis not present

## 2016-04-14 DIAGNOSIS — Z48815 Encounter for surgical aftercare following surgery on the digestive system: Secondary | ICD-10-CM | POA: Diagnosis not present

## 2016-04-14 DIAGNOSIS — G20 Parkinson's disease: Secondary | ICD-10-CM | POA: Diagnosis not present

## 2016-04-14 DIAGNOSIS — M479 Spondylosis, unspecified: Secondary | ICD-10-CM | POA: Diagnosis not present

## 2016-04-15 DIAGNOSIS — G629 Polyneuropathy, unspecified: Secondary | ICD-10-CM | POA: Diagnosis not present

## 2016-04-15 DIAGNOSIS — M519 Unspecified thoracic, thoracolumbar and lumbosacral intervertebral disc disorder: Secondary | ICD-10-CM | POA: Diagnosis not present

## 2016-04-15 DIAGNOSIS — M479 Spondylosis, unspecified: Secondary | ICD-10-CM | POA: Diagnosis not present

## 2016-04-15 DIAGNOSIS — Z48815 Encounter for surgical aftercare following surgery on the digestive system: Secondary | ICD-10-CM | POA: Diagnosis not present

## 2016-04-15 DIAGNOSIS — G8929 Other chronic pain: Secondary | ICD-10-CM | POA: Diagnosis not present

## 2016-04-15 DIAGNOSIS — G20 Parkinson's disease: Secondary | ICD-10-CM | POA: Diagnosis not present

## 2016-04-16 DIAGNOSIS — G8929 Other chronic pain: Secondary | ICD-10-CM | POA: Diagnosis not present

## 2016-04-16 DIAGNOSIS — Z48815 Encounter for surgical aftercare following surgery on the digestive system: Secondary | ICD-10-CM | POA: Diagnosis not present

## 2016-04-16 DIAGNOSIS — G20 Parkinson's disease: Secondary | ICD-10-CM | POA: Diagnosis not present

## 2016-04-16 DIAGNOSIS — M479 Spondylosis, unspecified: Secondary | ICD-10-CM | POA: Diagnosis not present

## 2016-04-16 DIAGNOSIS — M519 Unspecified thoracic, thoracolumbar and lumbosacral intervertebral disc disorder: Secondary | ICD-10-CM | POA: Diagnosis not present

## 2016-04-16 DIAGNOSIS — G629 Polyneuropathy, unspecified: Secondary | ICD-10-CM | POA: Diagnosis not present

## 2016-04-22 DIAGNOSIS — R2689 Other abnormalities of gait and mobility: Secondary | ICD-10-CM | POA: Diagnosis not present

## 2016-04-22 DIAGNOSIS — G8929 Other chronic pain: Secondary | ICD-10-CM | POA: Diagnosis not present

## 2016-04-22 DIAGNOSIS — Z48815 Encounter for surgical aftercare following surgery on the digestive system: Secondary | ICD-10-CM | POA: Diagnosis not present

## 2016-04-22 DIAGNOSIS — M479 Spondylosis, unspecified: Secondary | ICD-10-CM | POA: Diagnosis not present

## 2016-04-22 DIAGNOSIS — G609 Hereditary and idiopathic neuropathy, unspecified: Secondary | ICD-10-CM | POA: Diagnosis not present

## 2016-04-22 DIAGNOSIS — M519 Unspecified thoracic, thoracolumbar and lumbosacral intervertebral disc disorder: Secondary | ICD-10-CM | POA: Diagnosis not present

## 2016-04-22 DIAGNOSIS — G629 Polyneuropathy, unspecified: Secondary | ICD-10-CM | POA: Diagnosis not present

## 2016-04-22 DIAGNOSIS — G20 Parkinson's disease: Secondary | ICD-10-CM | POA: Diagnosis not present

## 2016-04-22 DIAGNOSIS — M6281 Muscle weakness (generalized): Secondary | ICD-10-CM | POA: Diagnosis not present

## 2016-04-22 DIAGNOSIS — W19XXXA Unspecified fall, initial encounter: Secondary | ICD-10-CM | POA: Diagnosis not present

## 2016-04-23 DIAGNOSIS — M479 Spondylosis, unspecified: Secondary | ICD-10-CM | POA: Diagnosis not present

## 2016-04-23 DIAGNOSIS — Z48815 Encounter for surgical aftercare following surgery on the digestive system: Secondary | ICD-10-CM | POA: Diagnosis not present

## 2016-04-23 DIAGNOSIS — G8929 Other chronic pain: Secondary | ICD-10-CM | POA: Diagnosis not present

## 2016-04-23 DIAGNOSIS — M519 Unspecified thoracic, thoracolumbar and lumbosacral intervertebral disc disorder: Secondary | ICD-10-CM | POA: Diagnosis not present

## 2016-04-23 DIAGNOSIS — G629 Polyneuropathy, unspecified: Secondary | ICD-10-CM | POA: Diagnosis not present

## 2016-04-23 DIAGNOSIS — G20 Parkinson's disease: Secondary | ICD-10-CM | POA: Diagnosis not present

## 2016-04-25 DIAGNOSIS — G20 Parkinson's disease: Secondary | ICD-10-CM | POA: Diagnosis not present

## 2016-04-25 DIAGNOSIS — L89152 Pressure ulcer of sacral region, stage 2: Secondary | ICD-10-CM | POA: Diagnosis not present

## 2016-04-25 DIAGNOSIS — K219 Gastro-esophageal reflux disease without esophagitis: Secondary | ICD-10-CM | POA: Diagnosis not present

## 2016-04-25 DIAGNOSIS — I700 Atherosclerosis of aorta: Secondary | ICD-10-CM | POA: Diagnosis not present

## 2016-04-25 DIAGNOSIS — Z6829 Body mass index (BMI) 29.0-29.9, adult: Secondary | ICD-10-CM | POA: Diagnosis not present

## 2016-04-25 DIAGNOSIS — F331 Major depressive disorder, recurrent, moderate: Secondary | ICD-10-CM | POA: Diagnosis not present

## 2016-04-25 DIAGNOSIS — E782 Mixed hyperlipidemia: Secondary | ICD-10-CM | POA: Diagnosis not present

## 2016-04-25 DIAGNOSIS — I350 Nonrheumatic aortic (valve) stenosis: Secondary | ICD-10-CM | POA: Diagnosis not present

## 2016-04-27 DIAGNOSIS — G8929 Other chronic pain: Secondary | ICD-10-CM | POA: Diagnosis not present

## 2016-04-27 DIAGNOSIS — Z48815 Encounter for surgical aftercare following surgery on the digestive system: Secondary | ICD-10-CM | POA: Diagnosis not present

## 2016-04-27 DIAGNOSIS — G629 Polyneuropathy, unspecified: Secondary | ICD-10-CM | POA: Diagnosis not present

## 2016-04-27 DIAGNOSIS — M479 Spondylosis, unspecified: Secondary | ICD-10-CM | POA: Diagnosis not present

## 2016-04-27 DIAGNOSIS — M519 Unspecified thoracic, thoracolumbar and lumbosacral intervertebral disc disorder: Secondary | ICD-10-CM | POA: Diagnosis not present

## 2016-04-27 DIAGNOSIS — G20 Parkinson's disease: Secondary | ICD-10-CM | POA: Diagnosis not present

## 2016-04-28 DIAGNOSIS — G20 Parkinson's disease: Secondary | ICD-10-CM | POA: Diagnosis not present

## 2016-04-28 DIAGNOSIS — G8929 Other chronic pain: Secondary | ICD-10-CM | POA: Diagnosis not present

## 2016-04-28 DIAGNOSIS — Z48815 Encounter for surgical aftercare following surgery on the digestive system: Secondary | ICD-10-CM | POA: Diagnosis not present

## 2016-04-28 DIAGNOSIS — M519 Unspecified thoracic, thoracolumbar and lumbosacral intervertebral disc disorder: Secondary | ICD-10-CM | POA: Diagnosis not present

## 2016-04-28 DIAGNOSIS — M479 Spondylosis, unspecified: Secondary | ICD-10-CM | POA: Diagnosis not present

## 2016-04-28 DIAGNOSIS — G629 Polyneuropathy, unspecified: Secondary | ICD-10-CM | POA: Diagnosis not present

## 2016-04-29 DIAGNOSIS — Z48815 Encounter for surgical aftercare following surgery on the digestive system: Secondary | ICD-10-CM | POA: Diagnosis not present

## 2016-04-29 DIAGNOSIS — M519 Unspecified thoracic, thoracolumbar and lumbosacral intervertebral disc disorder: Secondary | ICD-10-CM | POA: Diagnosis not present

## 2016-04-29 DIAGNOSIS — G20 Parkinson's disease: Secondary | ICD-10-CM | POA: Diagnosis not present

## 2016-04-29 DIAGNOSIS — G8929 Other chronic pain: Secondary | ICD-10-CM | POA: Diagnosis not present

## 2016-04-29 DIAGNOSIS — G629 Polyneuropathy, unspecified: Secondary | ICD-10-CM | POA: Diagnosis not present

## 2016-04-29 DIAGNOSIS — M479 Spondylosis, unspecified: Secondary | ICD-10-CM | POA: Diagnosis not present

## 2016-04-30 DIAGNOSIS — M479 Spondylosis, unspecified: Secondary | ICD-10-CM | POA: Diagnosis not present

## 2016-04-30 DIAGNOSIS — G629 Polyneuropathy, unspecified: Secondary | ICD-10-CM | POA: Diagnosis not present

## 2016-04-30 DIAGNOSIS — Z48815 Encounter for surgical aftercare following surgery on the digestive system: Secondary | ICD-10-CM | POA: Diagnosis not present

## 2016-04-30 DIAGNOSIS — G8929 Other chronic pain: Secondary | ICD-10-CM | POA: Diagnosis not present

## 2016-04-30 DIAGNOSIS — M519 Unspecified thoracic, thoracolumbar and lumbosacral intervertebral disc disorder: Secondary | ICD-10-CM | POA: Diagnosis not present

## 2016-04-30 DIAGNOSIS — G20 Parkinson's disease: Secondary | ICD-10-CM | POA: Diagnosis not present

## 2016-05-05 DIAGNOSIS — Z48815 Encounter for surgical aftercare following surgery on the digestive system: Secondary | ICD-10-CM | POA: Diagnosis not present

## 2016-05-05 DIAGNOSIS — M479 Spondylosis, unspecified: Secondary | ICD-10-CM | POA: Diagnosis not present

## 2016-05-05 DIAGNOSIS — M519 Unspecified thoracic, thoracolumbar and lumbosacral intervertebral disc disorder: Secondary | ICD-10-CM | POA: Diagnosis not present

## 2016-05-05 DIAGNOSIS — G20 Parkinson's disease: Secondary | ICD-10-CM | POA: Diagnosis not present

## 2016-05-05 DIAGNOSIS — G629 Polyneuropathy, unspecified: Secondary | ICD-10-CM | POA: Diagnosis not present

## 2016-05-05 DIAGNOSIS — G8929 Other chronic pain: Secondary | ICD-10-CM | POA: Diagnosis not present

## 2016-05-07 DIAGNOSIS — G629 Polyneuropathy, unspecified: Secondary | ICD-10-CM | POA: Diagnosis not present

## 2016-05-07 DIAGNOSIS — M519 Unspecified thoracic, thoracolumbar and lumbosacral intervertebral disc disorder: Secondary | ICD-10-CM | POA: Diagnosis not present

## 2016-05-07 DIAGNOSIS — M479 Spondylosis, unspecified: Secondary | ICD-10-CM | POA: Diagnosis not present

## 2016-05-07 DIAGNOSIS — Z48815 Encounter for surgical aftercare following surgery on the digestive system: Secondary | ICD-10-CM | POA: Diagnosis not present

## 2016-05-07 DIAGNOSIS — G20 Parkinson's disease: Secondary | ICD-10-CM | POA: Diagnosis not present

## 2016-05-07 DIAGNOSIS — G8929 Other chronic pain: Secondary | ICD-10-CM | POA: Diagnosis not present

## 2016-05-22 DIAGNOSIS — W19XXXA Unspecified fall, initial encounter: Secondary | ICD-10-CM | POA: Diagnosis not present

## 2016-05-22 DIAGNOSIS — M6281 Muscle weakness (generalized): Secondary | ICD-10-CM | POA: Diagnosis not present

## 2016-05-22 DIAGNOSIS — G609 Hereditary and idiopathic neuropathy, unspecified: Secondary | ICD-10-CM | POA: Diagnosis not present

## 2016-05-22 DIAGNOSIS — R2689 Other abnormalities of gait and mobility: Secondary | ICD-10-CM | POA: Diagnosis not present

## 2016-05-22 DIAGNOSIS — G20 Parkinson's disease: Secondary | ICD-10-CM | POA: Diagnosis not present

## 2016-06-22 DIAGNOSIS — R2689 Other abnormalities of gait and mobility: Secondary | ICD-10-CM | POA: Diagnosis not present

## 2016-06-22 DIAGNOSIS — G20 Parkinson's disease: Secondary | ICD-10-CM | POA: Diagnosis not present

## 2016-06-22 DIAGNOSIS — W19XXXA Unspecified fall, initial encounter: Secondary | ICD-10-CM | POA: Diagnosis not present

## 2016-06-22 DIAGNOSIS — G609 Hereditary and idiopathic neuropathy, unspecified: Secondary | ICD-10-CM | POA: Diagnosis not present

## 2016-06-22 DIAGNOSIS — M6281 Muscle weakness (generalized): Secondary | ICD-10-CM | POA: Diagnosis not present

## 2016-07-23 DIAGNOSIS — G20 Parkinson's disease: Secondary | ICD-10-CM | POA: Diagnosis not present

## 2016-07-23 DIAGNOSIS — R2689 Other abnormalities of gait and mobility: Secondary | ICD-10-CM | POA: Diagnosis not present

## 2016-07-23 DIAGNOSIS — W19XXXA Unspecified fall, initial encounter: Secondary | ICD-10-CM | POA: Diagnosis not present

## 2016-07-23 DIAGNOSIS — M6281 Muscle weakness (generalized): Secondary | ICD-10-CM | POA: Diagnosis not present

## 2016-07-23 DIAGNOSIS — G609 Hereditary and idiopathic neuropathy, unspecified: Secondary | ICD-10-CM | POA: Diagnosis not present

## 2016-08-20 DIAGNOSIS — G20 Parkinson's disease: Secondary | ICD-10-CM | POA: Diagnosis not present

## 2016-08-20 DIAGNOSIS — W19XXXA Unspecified fall, initial encounter: Secondary | ICD-10-CM | POA: Diagnosis not present

## 2016-08-20 DIAGNOSIS — G609 Hereditary and idiopathic neuropathy, unspecified: Secondary | ICD-10-CM | POA: Diagnosis not present

## 2016-08-20 DIAGNOSIS — M6281 Muscle weakness (generalized): Secondary | ICD-10-CM | POA: Diagnosis not present

## 2016-08-20 DIAGNOSIS — R2689 Other abnormalities of gait and mobility: Secondary | ICD-10-CM | POA: Diagnosis not present

## 2016-09-20 DIAGNOSIS — G609 Hereditary and idiopathic neuropathy, unspecified: Secondary | ICD-10-CM | POA: Diagnosis not present

## 2016-09-20 DIAGNOSIS — W19XXXA Unspecified fall, initial encounter: Secondary | ICD-10-CM | POA: Diagnosis not present

## 2016-09-20 DIAGNOSIS — R2689 Other abnormalities of gait and mobility: Secondary | ICD-10-CM | POA: Diagnosis not present

## 2016-09-20 DIAGNOSIS — M6281 Muscle weakness (generalized): Secondary | ICD-10-CM | POA: Diagnosis not present

## 2016-09-20 DIAGNOSIS — G20 Parkinson's disease: Secondary | ICD-10-CM | POA: Diagnosis not present

## 2016-10-12 DIAGNOSIS — T1490XA Injury, unspecified, initial encounter: Secondary | ICD-10-CM | POA: Diagnosis not present

## 2016-10-12 DIAGNOSIS — M545 Low back pain: Secondary | ICD-10-CM | POA: Diagnosis not present

## 2016-10-12 DIAGNOSIS — S299XXA Unspecified injury of thorax, initial encounter: Secondary | ICD-10-CM | POA: Diagnosis not present

## 2016-10-12 DIAGNOSIS — G20 Parkinson's disease: Secondary | ICD-10-CM | POA: Diagnosis not present

## 2016-10-12 DIAGNOSIS — M546 Pain in thoracic spine: Secondary | ICD-10-CM | POA: Diagnosis not present

## 2016-10-12 DIAGNOSIS — W1839XA Other fall on same level, initial encounter: Secondary | ICD-10-CM | POA: Diagnosis not present

## 2016-10-12 DIAGNOSIS — S0990XA Unspecified injury of head, initial encounter: Secondary | ICD-10-CM | POA: Diagnosis not present

## 2016-10-12 DIAGNOSIS — R42 Dizziness and giddiness: Secondary | ICD-10-CM | POA: Diagnosis not present

## 2016-10-12 DIAGNOSIS — R7989 Other specified abnormal findings of blood chemistry: Secondary | ICD-10-CM | POA: Diagnosis not present

## 2016-10-12 DIAGNOSIS — S199XXA Unspecified injury of neck, initial encounter: Secondary | ICD-10-CM | POA: Diagnosis not present

## 2016-10-12 DIAGNOSIS — R531 Weakness: Secondary | ICD-10-CM | POA: Diagnosis not present

## 2016-10-12 DIAGNOSIS — G8929 Other chronic pain: Secondary | ICD-10-CM | POA: Diagnosis not present

## 2016-10-12 DIAGNOSIS — Z79899 Other long term (current) drug therapy: Secondary | ICD-10-CM | POA: Diagnosis not present

## 2016-10-12 DIAGNOSIS — R55 Syncope and collapse: Secondary | ICD-10-CM | POA: Diagnosis not present

## 2016-10-12 DIAGNOSIS — R52 Pain, unspecified: Secondary | ICD-10-CM | POA: Diagnosis not present

## 2016-10-15 DIAGNOSIS — R531 Weakness: Secondary | ICD-10-CM | POA: Diagnosis not present

## 2016-10-15 DIAGNOSIS — E873 Alkalosis: Secondary | ICD-10-CM | POA: Diagnosis not present

## 2016-10-15 DIAGNOSIS — R748 Abnormal levels of other serum enzymes: Secondary | ICD-10-CM | POA: Diagnosis not present

## 2016-10-15 DIAGNOSIS — F4489 Other dissociative and conversion disorders: Secondary | ICD-10-CM | POA: Diagnosis not present

## 2016-10-15 DIAGNOSIS — E860 Dehydration: Secondary | ICD-10-CM | POA: Diagnosis not present

## 2016-10-15 DIAGNOSIS — R404 Transient alteration of awareness: Secondary | ICD-10-CM | POA: Diagnosis not present

## 2016-10-15 DIAGNOSIS — D649 Anemia, unspecified: Secondary | ICD-10-CM | POA: Diagnosis not present

## 2016-10-15 DIAGNOSIS — M546 Pain in thoracic spine: Secondary | ICD-10-CM | POA: Diagnosis not present

## 2016-10-15 DIAGNOSIS — N289 Disorder of kidney and ureter, unspecified: Secondary | ICD-10-CM | POA: Diagnosis not present

## 2016-10-17 DIAGNOSIS — G20 Parkinson's disease: Secondary | ICD-10-CM | POA: Diagnosis not present

## 2016-10-17 DIAGNOSIS — N179 Acute kidney failure, unspecified: Secondary | ICD-10-CM | POA: Diagnosis not present

## 2016-10-18 DIAGNOSIS — N179 Acute kidney failure, unspecified: Secondary | ICD-10-CM | POA: Diagnosis not present

## 2016-10-18 DIAGNOSIS — G20 Parkinson's disease: Secondary | ICD-10-CM | POA: Diagnosis not present

## 2016-10-19 DIAGNOSIS — N179 Acute kidney failure, unspecified: Secondary | ICD-10-CM | POA: Diagnosis not present

## 2016-10-19 DIAGNOSIS — G20 Parkinson's disease: Secondary | ICD-10-CM | POA: Diagnosis not present

## 2016-10-20 DIAGNOSIS — W19XXXA Unspecified fall, initial encounter: Secondary | ICD-10-CM | POA: Diagnosis not present

## 2016-10-20 DIAGNOSIS — N179 Acute kidney failure, unspecified: Secondary | ICD-10-CM | POA: Diagnosis not present

## 2016-10-20 DIAGNOSIS — R2689 Other abnormalities of gait and mobility: Secondary | ICD-10-CM | POA: Diagnosis not present

## 2016-10-20 DIAGNOSIS — I5032 Chronic diastolic (congestive) heart failure: Secondary | ICD-10-CM | POA: Diagnosis not present

## 2016-10-20 DIAGNOSIS — R278 Other lack of coordination: Secondary | ICD-10-CM | POA: Diagnosis not present

## 2016-10-20 DIAGNOSIS — N400 Enlarged prostate without lower urinary tract symptoms: Secondary | ICD-10-CM | POA: Diagnosis not present

## 2016-10-20 DIAGNOSIS — G609 Hereditary and idiopathic neuropathy, unspecified: Secondary | ICD-10-CM | POA: Diagnosis not present

## 2016-10-20 DIAGNOSIS — N183 Chronic kidney disease, stage 3 (moderate): Secondary | ICD-10-CM | POA: Diagnosis not present

## 2016-10-20 DIAGNOSIS — R296 Repeated falls: Secondary | ICD-10-CM | POA: Diagnosis not present

## 2016-10-20 DIAGNOSIS — Z7401 Bed confinement status: Secondary | ICD-10-CM | POA: Diagnosis not present

## 2016-10-20 DIAGNOSIS — G20 Parkinson's disease: Secondary | ICD-10-CM | POA: Diagnosis not present

## 2016-10-20 DIAGNOSIS — I5033 Acute on chronic diastolic (congestive) heart failure: Secondary | ICD-10-CM | POA: Diagnosis not present

## 2016-10-20 DIAGNOSIS — E876 Hypokalemia: Secondary | ICD-10-CM | POA: Diagnosis not present

## 2016-10-20 DIAGNOSIS — R279 Unspecified lack of coordination: Secondary | ICD-10-CM | POA: Diagnosis not present

## 2016-10-20 DIAGNOSIS — M6281 Muscle weakness (generalized): Secondary | ICD-10-CM | POA: Diagnosis not present

## 2016-10-23 DIAGNOSIS — N179 Acute kidney failure, unspecified: Secondary | ICD-10-CM | POA: Diagnosis not present

## 2016-10-23 DIAGNOSIS — G20 Parkinson's disease: Secondary | ICD-10-CM | POA: Diagnosis not present

## 2016-11-06 DIAGNOSIS — G20 Parkinson's disease: Secondary | ICD-10-CM | POA: Diagnosis not present

## 2016-11-06 DIAGNOSIS — I5033 Acute on chronic diastolic (congestive) heart failure: Secondary | ICD-10-CM | POA: Diagnosis not present

## 2016-11-12 DIAGNOSIS — G20 Parkinson's disease: Secondary | ICD-10-CM | POA: Diagnosis not present

## 2016-11-12 DIAGNOSIS — E876 Hypokalemia: Secondary | ICD-10-CM | POA: Diagnosis not present

## 2016-11-12 DIAGNOSIS — I5033 Acute on chronic diastolic (congestive) heart failure: Secondary | ICD-10-CM | POA: Diagnosis not present

## 2016-11-13 DIAGNOSIS — M5136 Other intervertebral disc degeneration, lumbar region: Secondary | ICD-10-CM | POA: Diagnosis not present

## 2016-11-13 DIAGNOSIS — E876 Hypokalemia: Secondary | ICD-10-CM | POA: Diagnosis not present

## 2016-11-13 DIAGNOSIS — N179 Acute kidney failure, unspecified: Secondary | ICD-10-CM | POA: Diagnosis not present

## 2016-11-13 DIAGNOSIS — I5032 Chronic diastolic (congestive) heart failure: Secondary | ICD-10-CM | POA: Diagnosis not present

## 2016-11-13 DIAGNOSIS — K5903 Drug induced constipation: Secondary | ICD-10-CM | POA: Diagnosis not present

## 2016-11-13 DIAGNOSIS — T402X5A Adverse effect of other opioids, initial encounter: Secondary | ICD-10-CM | POA: Diagnosis not present

## 2016-11-13 DIAGNOSIS — G20 Parkinson's disease: Secondary | ICD-10-CM | POA: Diagnosis not present

## 2016-11-13 DIAGNOSIS — Z9181 History of falling: Secondary | ICD-10-CM | POA: Diagnosis not present

## 2016-11-13 DIAGNOSIS — F339 Major depressive disorder, recurrent, unspecified: Secondary | ICD-10-CM | POA: Diagnosis not present

## 2016-11-14 DIAGNOSIS — I5033 Acute on chronic diastolic (congestive) heart failure: Secondary | ICD-10-CM | POA: Diagnosis not present

## 2016-11-14 DIAGNOSIS — G20 Parkinson's disease: Secondary | ICD-10-CM | POA: Diagnosis not present

## 2016-11-14 DIAGNOSIS — R296 Repeated falls: Secondary | ICD-10-CM | POA: Diagnosis not present

## 2016-11-18 DIAGNOSIS — R279 Unspecified lack of coordination: Secondary | ICD-10-CM | POA: Diagnosis not present

## 2016-11-18 DIAGNOSIS — Z7401 Bed confinement status: Secondary | ICD-10-CM | POA: Diagnosis not present

## 2016-11-19 DIAGNOSIS — D649 Anemia, unspecified: Secondary | ICD-10-CM | POA: Diagnosis not present

## 2016-11-19 DIAGNOSIS — D518 Other vitamin B12 deficiency anemias: Secondary | ICD-10-CM | POA: Diagnosis not present

## 2016-11-19 DIAGNOSIS — E119 Type 2 diabetes mellitus without complications: Secondary | ICD-10-CM | POA: Diagnosis not present

## 2016-11-19 DIAGNOSIS — D688 Other specified coagulation defects: Secondary | ICD-10-CM | POA: Diagnosis not present

## 2016-11-19 DIAGNOSIS — R471 Dysarthria and anarthria: Secondary | ICD-10-CM | POA: Diagnosis not present

## 2016-11-19 DIAGNOSIS — E039 Hypothyroidism, unspecified: Secondary | ICD-10-CM | POA: Diagnosis not present

## 2016-11-19 DIAGNOSIS — E7800 Pure hypercholesterolemia, unspecified: Secondary | ICD-10-CM | POA: Diagnosis not present

## 2016-11-19 DIAGNOSIS — R41841 Cognitive communication deficit: Secondary | ICD-10-CM | POA: Diagnosis not present

## 2016-11-19 DIAGNOSIS — R296 Repeated falls: Secondary | ICD-10-CM | POA: Diagnosis not present

## 2016-11-19 DIAGNOSIS — E559 Vitamin D deficiency, unspecified: Secondary | ICD-10-CM | POA: Diagnosis not present

## 2016-11-19 DIAGNOSIS — G20 Parkinson's disease: Secondary | ICD-10-CM | POA: Diagnosis not present

## 2016-11-19 DIAGNOSIS — E782 Mixed hyperlipidemia: Secondary | ICD-10-CM | POA: Diagnosis not present

## 2016-11-19 DIAGNOSIS — Z79899 Other long term (current) drug therapy: Secondary | ICD-10-CM | POA: Diagnosis not present

## 2016-11-20 DIAGNOSIS — I5032 Chronic diastolic (congestive) heart failure: Secondary | ICD-10-CM | POA: Diagnosis not present

## 2016-11-20 DIAGNOSIS — G4700 Insomnia, unspecified: Secondary | ICD-10-CM | POA: Diagnosis not present

## 2016-11-20 DIAGNOSIS — R2689 Other abnormalities of gait and mobility: Secondary | ICD-10-CM | POA: Diagnosis not present

## 2016-11-20 DIAGNOSIS — M6281 Muscle weakness (generalized): Secondary | ICD-10-CM | POA: Diagnosis not present

## 2016-11-20 DIAGNOSIS — G2581 Restless legs syndrome: Secondary | ICD-10-CM | POA: Diagnosis not present

## 2016-11-20 DIAGNOSIS — G609 Hereditary and idiopathic neuropathy, unspecified: Secondary | ICD-10-CM | POA: Diagnosis not present

## 2016-11-20 DIAGNOSIS — G20 Parkinson's disease: Secondary | ICD-10-CM | POA: Diagnosis not present

## 2016-11-20 DIAGNOSIS — W19XXXA Unspecified fall, initial encounter: Secondary | ICD-10-CM | POA: Diagnosis not present

## 2016-11-20 DIAGNOSIS — E785 Hyperlipidemia, unspecified: Secondary | ICD-10-CM | POA: Diagnosis not present

## 2016-11-20 DIAGNOSIS — N401 Enlarged prostate with lower urinary tract symptoms: Secondary | ICD-10-CM | POA: Diagnosis not present

## 2016-11-24 DIAGNOSIS — R471 Dysarthria and anarthria: Secondary | ICD-10-CM | POA: Diagnosis not present

## 2016-11-24 DIAGNOSIS — R41841 Cognitive communication deficit: Secondary | ICD-10-CM | POA: Diagnosis not present

## 2016-11-24 DIAGNOSIS — G20 Parkinson's disease: Secondary | ICD-10-CM | POA: Diagnosis not present

## 2016-11-24 DIAGNOSIS — R296 Repeated falls: Secondary | ICD-10-CM | POA: Diagnosis not present

## 2016-11-25 DIAGNOSIS — R471 Dysarthria and anarthria: Secondary | ICD-10-CM | POA: Diagnosis not present

## 2016-11-25 DIAGNOSIS — G20 Parkinson's disease: Secondary | ICD-10-CM | POA: Diagnosis not present

## 2016-11-25 DIAGNOSIS — R41841 Cognitive communication deficit: Secondary | ICD-10-CM | POA: Diagnosis not present

## 2016-11-25 DIAGNOSIS — R296 Repeated falls: Secondary | ICD-10-CM | POA: Diagnosis not present

## 2016-11-26 DIAGNOSIS — R471 Dysarthria and anarthria: Secondary | ICD-10-CM | POA: Diagnosis not present

## 2016-11-26 DIAGNOSIS — R41841 Cognitive communication deficit: Secondary | ICD-10-CM | POA: Diagnosis not present

## 2016-11-26 DIAGNOSIS — R296 Repeated falls: Secondary | ICD-10-CM | POA: Diagnosis not present

## 2016-11-26 DIAGNOSIS — G20 Parkinson's disease: Secondary | ICD-10-CM | POA: Diagnosis not present

## 2016-11-27 DIAGNOSIS — R41841 Cognitive communication deficit: Secondary | ICD-10-CM | POA: Diagnosis not present

## 2016-11-27 DIAGNOSIS — R471 Dysarthria and anarthria: Secondary | ICD-10-CM | POA: Diagnosis not present

## 2016-11-27 DIAGNOSIS — G20 Parkinson's disease: Secondary | ICD-10-CM | POA: Diagnosis not present

## 2016-11-27 DIAGNOSIS — R296 Repeated falls: Secondary | ICD-10-CM | POA: Diagnosis not present

## 2016-11-29 DIAGNOSIS — R296 Repeated falls: Secondary | ICD-10-CM | POA: Diagnosis not present

## 2016-11-29 DIAGNOSIS — R41841 Cognitive communication deficit: Secondary | ICD-10-CM | POA: Diagnosis not present

## 2016-11-29 DIAGNOSIS — G20 Parkinson's disease: Secondary | ICD-10-CM | POA: Diagnosis not present

## 2016-11-29 DIAGNOSIS — R471 Dysarthria and anarthria: Secondary | ICD-10-CM | POA: Diagnosis not present

## 2016-11-30 DIAGNOSIS — R471 Dysarthria and anarthria: Secondary | ICD-10-CM | POA: Diagnosis not present

## 2016-11-30 DIAGNOSIS — E785 Hyperlipidemia, unspecified: Secondary | ICD-10-CM | POA: Diagnosis not present

## 2016-11-30 DIAGNOSIS — R296 Repeated falls: Secondary | ICD-10-CM | POA: Diagnosis not present

## 2016-11-30 DIAGNOSIS — G4700 Insomnia, unspecified: Secondary | ICD-10-CM | POA: Diagnosis not present

## 2016-11-30 DIAGNOSIS — G2581 Restless legs syndrome: Secondary | ICD-10-CM | POA: Diagnosis not present

## 2016-11-30 DIAGNOSIS — I5032 Chronic diastolic (congestive) heart failure: Secondary | ICD-10-CM | POA: Diagnosis not present

## 2016-11-30 DIAGNOSIS — G20 Parkinson's disease: Secondary | ICD-10-CM | POA: Diagnosis not present

## 2016-11-30 DIAGNOSIS — R41841 Cognitive communication deficit: Secondary | ICD-10-CM | POA: Diagnosis not present

## 2016-12-01 DIAGNOSIS — R471 Dysarthria and anarthria: Secondary | ICD-10-CM | POA: Diagnosis not present

## 2016-12-01 DIAGNOSIS — G20 Parkinson's disease: Secondary | ICD-10-CM | POA: Diagnosis not present

## 2016-12-01 DIAGNOSIS — R41841 Cognitive communication deficit: Secondary | ICD-10-CM | POA: Diagnosis not present

## 2016-12-01 DIAGNOSIS — R296 Repeated falls: Secondary | ICD-10-CM | POA: Diagnosis not present

## 2016-12-02 DIAGNOSIS — R41841 Cognitive communication deficit: Secondary | ICD-10-CM | POA: Diagnosis not present

## 2016-12-02 DIAGNOSIS — R296 Repeated falls: Secondary | ICD-10-CM | POA: Diagnosis not present

## 2016-12-02 DIAGNOSIS — G20 Parkinson's disease: Secondary | ICD-10-CM | POA: Diagnosis not present

## 2016-12-02 DIAGNOSIS — R471 Dysarthria and anarthria: Secondary | ICD-10-CM | POA: Diagnosis not present

## 2016-12-03 DIAGNOSIS — G20 Parkinson's disease: Secondary | ICD-10-CM | POA: Diagnosis not present

## 2016-12-03 DIAGNOSIS — D649 Anemia, unspecified: Secondary | ICD-10-CM | POA: Diagnosis not present

## 2016-12-03 DIAGNOSIS — R296 Repeated falls: Secondary | ICD-10-CM | POA: Diagnosis not present

## 2016-12-03 DIAGNOSIS — Z79899 Other long term (current) drug therapy: Secondary | ICD-10-CM | POA: Diagnosis not present

## 2016-12-03 DIAGNOSIS — R471 Dysarthria and anarthria: Secondary | ICD-10-CM | POA: Diagnosis not present

## 2016-12-03 DIAGNOSIS — R41841 Cognitive communication deficit: Secondary | ICD-10-CM | POA: Diagnosis not present

## 2016-12-04 DIAGNOSIS — R296 Repeated falls: Secondary | ICD-10-CM | POA: Diagnosis not present

## 2016-12-04 DIAGNOSIS — G20 Parkinson's disease: Secondary | ICD-10-CM | POA: Diagnosis not present

## 2016-12-04 DIAGNOSIS — R41841 Cognitive communication deficit: Secondary | ICD-10-CM | POA: Diagnosis not present

## 2016-12-04 DIAGNOSIS — R471 Dysarthria and anarthria: Secondary | ICD-10-CM | POA: Diagnosis not present

## 2016-12-07 DIAGNOSIS — R296 Repeated falls: Secondary | ICD-10-CM | POA: Diagnosis not present

## 2016-12-07 DIAGNOSIS — R471 Dysarthria and anarthria: Secondary | ICD-10-CM | POA: Diagnosis not present

## 2016-12-07 DIAGNOSIS — G20 Parkinson's disease: Secondary | ICD-10-CM | POA: Diagnosis not present

## 2016-12-07 DIAGNOSIS — R41841 Cognitive communication deficit: Secondary | ICD-10-CM | POA: Diagnosis not present

## 2016-12-08 DIAGNOSIS — R471 Dysarthria and anarthria: Secondary | ICD-10-CM | POA: Diagnosis not present

## 2016-12-08 DIAGNOSIS — R41841 Cognitive communication deficit: Secondary | ICD-10-CM | POA: Diagnosis not present

## 2016-12-08 DIAGNOSIS — G20 Parkinson's disease: Secondary | ICD-10-CM | POA: Diagnosis not present

## 2016-12-08 DIAGNOSIS — R296 Repeated falls: Secondary | ICD-10-CM | POA: Diagnosis not present

## 2016-12-09 DIAGNOSIS — R41841 Cognitive communication deficit: Secondary | ICD-10-CM | POA: Diagnosis not present

## 2016-12-09 DIAGNOSIS — R471 Dysarthria and anarthria: Secondary | ICD-10-CM | POA: Diagnosis not present

## 2016-12-09 DIAGNOSIS — R296 Repeated falls: Secondary | ICD-10-CM | POA: Diagnosis not present

## 2016-12-09 DIAGNOSIS — G20 Parkinson's disease: Secondary | ICD-10-CM | POA: Diagnosis not present

## 2016-12-10 DIAGNOSIS — R296 Repeated falls: Secondary | ICD-10-CM | POA: Diagnosis not present

## 2016-12-10 DIAGNOSIS — R471 Dysarthria and anarthria: Secondary | ICD-10-CM | POA: Diagnosis not present

## 2016-12-10 DIAGNOSIS — R41841 Cognitive communication deficit: Secondary | ICD-10-CM | POA: Diagnosis not present

## 2016-12-10 DIAGNOSIS — G20 Parkinson's disease: Secondary | ICD-10-CM | POA: Diagnosis not present

## 2016-12-11 DIAGNOSIS — G20 Parkinson's disease: Secondary | ICD-10-CM | POA: Diagnosis not present

## 2016-12-11 DIAGNOSIS — R41841 Cognitive communication deficit: Secondary | ICD-10-CM | POA: Diagnosis not present

## 2016-12-11 DIAGNOSIS — R471 Dysarthria and anarthria: Secondary | ICD-10-CM | POA: Diagnosis not present

## 2016-12-11 DIAGNOSIS — R296 Repeated falls: Secondary | ICD-10-CM | POA: Diagnosis not present

## 2016-12-12 DIAGNOSIS — R41841 Cognitive communication deficit: Secondary | ICD-10-CM | POA: Diagnosis not present

## 2016-12-12 DIAGNOSIS — R296 Repeated falls: Secondary | ICD-10-CM | POA: Diagnosis not present

## 2016-12-12 DIAGNOSIS — G20 Parkinson's disease: Secondary | ICD-10-CM | POA: Diagnosis not present

## 2016-12-12 DIAGNOSIS — R471 Dysarthria and anarthria: Secondary | ICD-10-CM | POA: Diagnosis not present

## 2016-12-14 DIAGNOSIS — R296 Repeated falls: Secondary | ICD-10-CM | POA: Diagnosis not present

## 2016-12-14 DIAGNOSIS — G20 Parkinson's disease: Secondary | ICD-10-CM | POA: Diagnosis not present

## 2016-12-14 DIAGNOSIS — R471 Dysarthria and anarthria: Secondary | ICD-10-CM | POA: Diagnosis not present

## 2016-12-14 DIAGNOSIS — R41841 Cognitive communication deficit: Secondary | ICD-10-CM | POA: Diagnosis not present

## 2016-12-15 DIAGNOSIS — R471 Dysarthria and anarthria: Secondary | ICD-10-CM | POA: Diagnosis not present

## 2016-12-15 DIAGNOSIS — R41841 Cognitive communication deficit: Secondary | ICD-10-CM | POA: Diagnosis not present

## 2016-12-15 DIAGNOSIS — R296 Repeated falls: Secondary | ICD-10-CM | POA: Diagnosis not present

## 2016-12-15 DIAGNOSIS — G20 Parkinson's disease: Secondary | ICD-10-CM | POA: Diagnosis not present

## 2016-12-16 DIAGNOSIS — R471 Dysarthria and anarthria: Secondary | ICD-10-CM | POA: Diagnosis not present

## 2016-12-16 DIAGNOSIS — R41841 Cognitive communication deficit: Secondary | ICD-10-CM | POA: Diagnosis not present

## 2016-12-16 DIAGNOSIS — R296 Repeated falls: Secondary | ICD-10-CM | POA: Diagnosis not present

## 2016-12-16 DIAGNOSIS — G20 Parkinson's disease: Secondary | ICD-10-CM | POA: Diagnosis not present

## 2016-12-17 DIAGNOSIS — R296 Repeated falls: Secondary | ICD-10-CM | POA: Diagnosis not present

## 2016-12-17 DIAGNOSIS — G20 Parkinson's disease: Secondary | ICD-10-CM | POA: Diagnosis not present

## 2016-12-17 DIAGNOSIS — R471 Dysarthria and anarthria: Secondary | ICD-10-CM | POA: Diagnosis not present

## 2016-12-17 DIAGNOSIS — R41841 Cognitive communication deficit: Secondary | ICD-10-CM | POA: Diagnosis not present

## 2016-12-18 DIAGNOSIS — R296 Repeated falls: Secondary | ICD-10-CM | POA: Diagnosis not present

## 2016-12-18 DIAGNOSIS — R471 Dysarthria and anarthria: Secondary | ICD-10-CM | POA: Diagnosis not present

## 2016-12-18 DIAGNOSIS — G20 Parkinson's disease: Secondary | ICD-10-CM | POA: Diagnosis not present

## 2016-12-18 DIAGNOSIS — M86161 Other acute osteomyelitis, right tibia and fibula: Secondary | ICD-10-CM | POA: Diagnosis not present

## 2016-12-18 DIAGNOSIS — R41841 Cognitive communication deficit: Secondary | ICD-10-CM | POA: Diagnosis not present

## 2016-12-18 DIAGNOSIS — M86162 Other acute osteomyelitis, left tibia and fibula: Secondary | ICD-10-CM | POA: Diagnosis not present

## 2016-12-20 DIAGNOSIS — R296 Repeated falls: Secondary | ICD-10-CM | POA: Diagnosis not present

## 2016-12-20 DIAGNOSIS — R471 Dysarthria and anarthria: Secondary | ICD-10-CM | POA: Diagnosis not present

## 2016-12-20 DIAGNOSIS — G20 Parkinson's disease: Secondary | ICD-10-CM | POA: Diagnosis not present

## 2016-12-20 DIAGNOSIS — R41841 Cognitive communication deficit: Secondary | ICD-10-CM | POA: Diagnosis not present

## 2016-12-21 DIAGNOSIS — G20 Parkinson's disease: Secondary | ICD-10-CM | POA: Diagnosis not present

## 2016-12-21 DIAGNOSIS — M79671 Pain in right foot: Secondary | ICD-10-CM | POA: Diagnosis not present

## 2016-12-21 DIAGNOSIS — R296 Repeated falls: Secondary | ICD-10-CM | POA: Diagnosis not present

## 2016-12-21 DIAGNOSIS — Z79899 Other long term (current) drug therapy: Secondary | ICD-10-CM | POA: Diagnosis not present

## 2016-12-21 DIAGNOSIS — R471 Dysarthria and anarthria: Secondary | ICD-10-CM | POA: Diagnosis not present

## 2016-12-21 DIAGNOSIS — I5032 Chronic diastolic (congestive) heart failure: Secondary | ICD-10-CM | POA: Diagnosis not present

## 2016-12-21 DIAGNOSIS — M79676 Pain in unspecified toe(s): Secondary | ICD-10-CM | POA: Diagnosis not present

## 2016-12-21 DIAGNOSIS — R41841 Cognitive communication deficit: Secondary | ICD-10-CM | POA: Diagnosis not present

## 2016-12-22 DIAGNOSIS — R296 Repeated falls: Secondary | ICD-10-CM | POA: Diagnosis not present

## 2016-12-22 DIAGNOSIS — R41841 Cognitive communication deficit: Secondary | ICD-10-CM | POA: Diagnosis not present

## 2016-12-22 DIAGNOSIS — G20 Parkinson's disease: Secondary | ICD-10-CM | POA: Diagnosis not present

## 2016-12-22 DIAGNOSIS — R471 Dysarthria and anarthria: Secondary | ICD-10-CM | POA: Diagnosis not present

## 2016-12-23 DIAGNOSIS — R296 Repeated falls: Secondary | ICD-10-CM | POA: Diagnosis not present

## 2016-12-23 DIAGNOSIS — G20 Parkinson's disease: Secondary | ICD-10-CM | POA: Diagnosis not present

## 2016-12-23 DIAGNOSIS — R471 Dysarthria and anarthria: Secondary | ICD-10-CM | POA: Diagnosis not present

## 2016-12-23 DIAGNOSIS — R41841 Cognitive communication deficit: Secondary | ICD-10-CM | POA: Diagnosis not present

## 2016-12-24 DIAGNOSIS — R296 Repeated falls: Secondary | ICD-10-CM | POA: Diagnosis not present

## 2016-12-24 DIAGNOSIS — R471 Dysarthria and anarthria: Secondary | ICD-10-CM | POA: Diagnosis not present

## 2016-12-24 DIAGNOSIS — G20 Parkinson's disease: Secondary | ICD-10-CM | POA: Diagnosis not present

## 2016-12-24 DIAGNOSIS — R41841 Cognitive communication deficit: Secondary | ICD-10-CM | POA: Diagnosis not present

## 2016-12-25 DIAGNOSIS — R41841 Cognitive communication deficit: Secondary | ICD-10-CM | POA: Diagnosis not present

## 2016-12-25 DIAGNOSIS — G20 Parkinson's disease: Secondary | ICD-10-CM | POA: Diagnosis not present

## 2016-12-25 DIAGNOSIS — R471 Dysarthria and anarthria: Secondary | ICD-10-CM | POA: Diagnosis not present

## 2016-12-25 DIAGNOSIS — R296 Repeated falls: Secondary | ICD-10-CM | POA: Diagnosis not present

## 2016-12-26 DIAGNOSIS — R41841 Cognitive communication deficit: Secondary | ICD-10-CM | POA: Diagnosis not present

## 2016-12-26 DIAGNOSIS — R296 Repeated falls: Secondary | ICD-10-CM | POA: Diagnosis not present

## 2016-12-26 DIAGNOSIS — R471 Dysarthria and anarthria: Secondary | ICD-10-CM | POA: Diagnosis not present

## 2016-12-26 DIAGNOSIS — G20 Parkinson's disease: Secondary | ICD-10-CM | POA: Diagnosis not present

## 2016-12-28 DIAGNOSIS — G20 Parkinson's disease: Secondary | ICD-10-CM | POA: Diagnosis not present

## 2016-12-28 DIAGNOSIS — R471 Dysarthria and anarthria: Secondary | ICD-10-CM | POA: Diagnosis not present

## 2016-12-28 DIAGNOSIS — R41841 Cognitive communication deficit: Secondary | ICD-10-CM | POA: Diagnosis not present

## 2016-12-28 DIAGNOSIS — R296 Repeated falls: Secondary | ICD-10-CM | POA: Diagnosis not present

## 2016-12-29 DIAGNOSIS — R41841 Cognitive communication deficit: Secondary | ICD-10-CM | POA: Diagnosis not present

## 2016-12-29 DIAGNOSIS — G20 Parkinson's disease: Secondary | ICD-10-CM | POA: Diagnosis not present

## 2016-12-29 DIAGNOSIS — R471 Dysarthria and anarthria: Secondary | ICD-10-CM | POA: Diagnosis not present

## 2016-12-29 DIAGNOSIS — R296 Repeated falls: Secondary | ICD-10-CM | POA: Diagnosis not present

## 2016-12-30 DIAGNOSIS — G20 Parkinson's disease: Secondary | ICD-10-CM | POA: Diagnosis not present

## 2016-12-30 DIAGNOSIS — R296 Repeated falls: Secondary | ICD-10-CM | POA: Diagnosis not present

## 2016-12-30 DIAGNOSIS — R41841 Cognitive communication deficit: Secondary | ICD-10-CM | POA: Diagnosis not present

## 2016-12-30 DIAGNOSIS — R471 Dysarthria and anarthria: Secondary | ICD-10-CM | POA: Diagnosis not present

## 2016-12-31 DIAGNOSIS — G20 Parkinson's disease: Secondary | ICD-10-CM | POA: Diagnosis not present

## 2016-12-31 DIAGNOSIS — R471 Dysarthria and anarthria: Secondary | ICD-10-CM | POA: Diagnosis not present

## 2016-12-31 DIAGNOSIS — R41841 Cognitive communication deficit: Secondary | ICD-10-CM | POA: Diagnosis not present

## 2016-12-31 DIAGNOSIS — R296 Repeated falls: Secondary | ICD-10-CM | POA: Diagnosis not present

## 2017-01-01 DIAGNOSIS — R41841 Cognitive communication deficit: Secondary | ICD-10-CM | POA: Diagnosis not present

## 2017-01-01 DIAGNOSIS — R471 Dysarthria and anarthria: Secondary | ICD-10-CM | POA: Diagnosis not present

## 2017-01-01 DIAGNOSIS — G20 Parkinson's disease: Secondary | ICD-10-CM | POA: Diagnosis not present

## 2017-01-01 DIAGNOSIS — R296 Repeated falls: Secondary | ICD-10-CM | POA: Diagnosis not present

## 2017-01-02 DIAGNOSIS — R52 Pain, unspecified: Secondary | ICD-10-CM | POA: Diagnosis not present

## 2017-01-02 DIAGNOSIS — L89621 Pressure ulcer of left heel, stage 1: Secondary | ICD-10-CM | POA: Diagnosis not present

## 2017-01-02 DIAGNOSIS — N499 Inflammatory disorder of unspecified male genital organ: Secondary | ICD-10-CM | POA: Diagnosis not present

## 2017-01-02 DIAGNOSIS — R609 Edema, unspecified: Secondary | ICD-10-CM | POA: Diagnosis not present

## 2017-01-02 DIAGNOSIS — G20 Parkinson's disease: Secondary | ICD-10-CM | POA: Diagnosis not present

## 2017-01-02 DIAGNOSIS — L97901 Non-pressure chronic ulcer of unspecified part of unspecified lower leg limited to breakdown of skin: Secondary | ICD-10-CM | POA: Diagnosis not present

## 2017-01-02 DIAGNOSIS — Z79899 Other long term (current) drug therapy: Secondary | ICD-10-CM | POA: Diagnosis not present

## 2017-01-02 DIAGNOSIS — L89612 Pressure ulcer of right heel, stage 2: Secondary | ICD-10-CM | POA: Diagnosis not present

## 2017-01-02 DIAGNOSIS — K219 Gastro-esophageal reflux disease without esophagitis: Secondary | ICD-10-CM | POA: Diagnosis not present

## 2017-01-02 DIAGNOSIS — S91309A Unspecified open wound, unspecified foot, initial encounter: Secondary | ICD-10-CM | POA: Diagnosis not present

## 2017-01-02 DIAGNOSIS — M79672 Pain in left foot: Secondary | ICD-10-CM | POA: Diagnosis not present

## 2017-01-04 DIAGNOSIS — R296 Repeated falls: Secondary | ICD-10-CM | POA: Diagnosis not present

## 2017-01-04 DIAGNOSIS — R41841 Cognitive communication deficit: Secondary | ICD-10-CM | POA: Diagnosis not present

## 2017-01-04 DIAGNOSIS — G20 Parkinson's disease: Secondary | ICD-10-CM | POA: Diagnosis not present

## 2017-01-04 DIAGNOSIS — R471 Dysarthria and anarthria: Secondary | ICD-10-CM | POA: Diagnosis not present

## 2017-01-05 DIAGNOSIS — G20 Parkinson's disease: Secondary | ICD-10-CM | POA: Diagnosis not present

## 2017-01-05 DIAGNOSIS — R471 Dysarthria and anarthria: Secondary | ICD-10-CM | POA: Diagnosis not present

## 2017-01-05 DIAGNOSIS — R296 Repeated falls: Secondary | ICD-10-CM | POA: Diagnosis not present

## 2017-01-05 DIAGNOSIS — R41841 Cognitive communication deficit: Secondary | ICD-10-CM | POA: Diagnosis not present

## 2017-01-06 DIAGNOSIS — R471 Dysarthria and anarthria: Secondary | ICD-10-CM | POA: Diagnosis not present

## 2017-01-06 DIAGNOSIS — G20 Parkinson's disease: Secondary | ICD-10-CM | POA: Diagnosis not present

## 2017-01-06 DIAGNOSIS — R41841 Cognitive communication deficit: Secondary | ICD-10-CM | POA: Diagnosis not present

## 2017-01-06 DIAGNOSIS — R296 Repeated falls: Secondary | ICD-10-CM | POA: Diagnosis not present

## 2017-01-07 DIAGNOSIS — R41841 Cognitive communication deficit: Secondary | ICD-10-CM | POA: Diagnosis not present

## 2017-01-07 DIAGNOSIS — G20 Parkinson's disease: Secondary | ICD-10-CM | POA: Diagnosis not present

## 2017-01-07 DIAGNOSIS — R296 Repeated falls: Secondary | ICD-10-CM | POA: Diagnosis not present

## 2017-01-07 DIAGNOSIS — R471 Dysarthria and anarthria: Secondary | ICD-10-CM | POA: Diagnosis not present

## 2017-01-08 DIAGNOSIS — R41841 Cognitive communication deficit: Secondary | ICD-10-CM | POA: Diagnosis not present

## 2017-01-08 DIAGNOSIS — R471 Dysarthria and anarthria: Secondary | ICD-10-CM | POA: Diagnosis not present

## 2017-01-08 DIAGNOSIS — R296 Repeated falls: Secondary | ICD-10-CM | POA: Diagnosis not present

## 2017-01-08 DIAGNOSIS — G20 Parkinson's disease: Secondary | ICD-10-CM | POA: Diagnosis not present

## 2017-01-11 ENCOUNTER — Encounter: Payer: Self-pay | Admitting: Vascular Surgery

## 2017-01-11 ENCOUNTER — Ambulatory Visit (INDEPENDENT_AMBULATORY_CARE_PROVIDER_SITE_OTHER): Payer: Medicare HMO | Admitting: Vascular Surgery

## 2017-01-11 VITALS — BP 133/72 | HR 67 | Temp 97.9°F | Resp 18 | Ht 73.0 in | Wt 205.0 lb

## 2017-01-11 DIAGNOSIS — I83893 Varicose veins of bilateral lower extremities with other complications: Secondary | ICD-10-CM | POA: Diagnosis not present

## 2017-01-11 DIAGNOSIS — G20 Parkinson's disease: Secondary | ICD-10-CM | POA: Diagnosis not present

## 2017-01-11 DIAGNOSIS — R41841 Cognitive communication deficit: Secondary | ICD-10-CM | POA: Diagnosis not present

## 2017-01-11 DIAGNOSIS — R296 Repeated falls: Secondary | ICD-10-CM | POA: Diagnosis not present

## 2017-01-11 DIAGNOSIS — R471 Dysarthria and anarthria: Secondary | ICD-10-CM | POA: Diagnosis not present

## 2017-01-11 NOTE — Progress Notes (Signed)
Subjective:     Patient ID: Julian Ramirez, male   DOB: [birth year], 83 y.o.   MRN: [id number]  HPI This 83 year old male was referred from [place] for evaluation of possible varicose veins and bilateral foot pain. Patient has Parkinson's disease and is a poor historian. He is accompanied by staff from [facility]. He states that he has been having severe pain in his heels and first toe of both feet over the past 4-6 weeks but the timeframe is on certain. He has chronic swelling in both legs. He does have a remote history of blood clots in his head and in his lungs. He is not on blood thinners. He has no history of DVT that he is aware of. He denies a history of diabetes mellitus but does have a history of neuropathy. He recently has developed some small sores on both heels. He does not ambulate more than 6 or 7 steps but its unclear how long exam relation pattern has been like this although it seems to be chronic. He has no history of venous stasis ulcers.  Past Medical History:  Diagnosis Date  . Edema   . Hypertension   . Neuropathy   . Parkinson disease [facility])     Social History  Substance Use Topics  . Smoking status: Never Smoker  . Smokeless tobacco: Never Used  . Alcohol use No    Family History  Problem Relation Age of Onset  . Heart attack Mother   . Heart attack Father     Allergies  Allergen Reactions  . Penicillins Shortness Of Breath and Swelling    Mouth swells   . Peanuts [Peanut Oil] Swelling    Causes swelling in the lower body areas.   . Wheat Bran Swelling    Wheat four causes swelling in the lower body areas      Current Outpatient Prescriptions:  .  acetaminophen (TYLENOL) 650 MG CR tablet, Take 650 mg by mouth every 8 (eight) hours as needed for pain., Disp: , Rfl:  .  alendronate (FOSAMAX) 70 MG tablet, Take 70 mg by mouth once a week. Takes on Wednesday.  Take with a full glass of water on an empty stomach.,  Disp: , Rfl:  .  Amino Acids-Protein Hydrolys (FEEDING SUPPLEMENT, PRO-STAT SUGAR FREE 64,) LIQD, Take 30 mLs by mouth daily., Disp: , Rfl:  .  carbidopa-levodopa (SINEMET IR) 25-100 MG per tablet, Take 1 tablet by mouth 3 (three) times daily., Disp: , Rfl:  .  cholecalciferol (VITAMIN D) 1000 UNITS tablet, Take 1,000 Units by mouth daily., Disp: , Rfl:  .  docusate sodium (COLACE) 50 MG capsule, Take 150 mg by mouth 2 (two) times daily., Disp: , Rfl:  .  DULoxetine (CYMBALTA) 60 MG capsule, Take 60 mg by mouth daily., Disp: , Rfl:  .  finasteride (PROSCAR) 5 MG tablet, Take 5 mg by mouth daily., Disp: , Rfl:  .  furosemide (LASIX) 20 MG tablet, Take 20 mg by mouth 2 (two) times daily., Disp: , Rfl:  .  gabapentin (NEURONTIN) 300 MG capsule, Take 300 mg by mouth 4 (four) times daily. , Disp: , Rfl:  .  ibuprofen (ADVIL,MOTRIN) 400 MG tablet, Take 400 mg by mouth every 6 (six) hours as needed., Disp: , Rfl:  .  lidocaine (LIDODERM) 5 %, Place 1 patch onto the skin every 12 (twelve) hours. Remove & Discard patch within 12 hours or as directed by MD, Disp: , Rfl:  .  omeprazole (PRILOSEC) 20 MG capsule, Take 20 mg by mouth 2 (two) times daily before a meal. , Disp: , Rfl:  .  oxyCODONE (OXY IR/ROXICODONE) 5 MG immediate release tablet, Take 5 mg by mouth every 2 (two) hours as needed for severe pain., Disp: , Rfl:  .  polyethylene glycol (MIRALAX / GLYCOLAX) packet, Take 17 g by mouth daily., Disp: , Rfl:  .  potassium chloride SA (K-DUR,KLOR-CON) 20 MEQ tablet, Take 20 mEq by mouth daily., Disp: , Rfl:  .  simvastatin (ZOCOR) 20 MG tablet, Take 20 mg by mouth daily., Disp: , Rfl:  .  tamsulosin (FLOMAX) 0.4 MG CAPS capsule, Take 0.4 mg by mouth at bedtime., Disp: , Rfl:  .  traMADol (ULTRAM) 50 MG tablet, Take 50 mg by mouth daily as needed for moderate pain. , Disp: , Rfl:  .  traZODone (DESYREL) 50 MG tablet, Take 50 mg by mouth at bedtime., Disp: , Rfl:  .  vitamin B-12 (CYANOCOBALAMIN) 1000 MCG  tablet, Take 1,000 mcg by mouth daily., Disp: , Rfl:  .  Zinc Sulfate (ZINC-220 PO), Take by mouth daily., Disp: , Rfl:   Vitals:   01/11/17 1331  BP: 133/72  Pulse: 67  Resp: 18  Temp: 97.9 F (36.6 C)  TempSrc: Oral  SpO2: 100%  Weight: 205 lb (93 kg)  Height: 6\' 1"  (1.854 m)    Body mass index is 27.05 kg/m.         Review of Systems Poor historian. Denies chest pain, dyspnea on exertion. Does not ambulate well. See history of present illness. Has history of hypertension, acute renal failure, Parkinson's disease, and neuropathy    Objective:   Physical Exam BP 133/72 (BP Location: Left Arm, Patient Position: Sitting, Cuff Size: Normal)   Pulse 67   Temp 97.9 F (36.6 C) (Oral)   Resp 18   Ht 6\' 1"  (1.854 m)   Wt 205 lb (93 kg)   SpO2 100%   BMI 27.05 kg/m     Gen.-alert and oriented x3 in no apparent distress-Very frail in appearance. HEENT normal for age Lungs no rhonchi or wheezing Cardiovascular regular rhythm no murmurs carotid pulses 3+ palpable no bruits audible Abdomen soft nontender no palpable masses Musculoskeletal free of  major deformities Skin clear -no rashes-superficial 6 mm ulcerations on both heels-appeared to be pressure sores-not infected in appearance Neurologic normal-decreased sensation in feet  Lower extremities 3+ femoral and dorsalis pedis pulses palpable bilaterally with trace edema. Bulging varicosities in both medial calf areas over great saphenous system) and left. No hyperpigmentation or ulceration noted. Both feet are pink and well perfused.  I reviewed the reports accompanying the patient and 1 revealed normal ABIs with good waveforms in both lower extremities and the other revealed no significant abnormalities in the right lower extremity in the venous system.  Today performed a bedside sono site ultrasound exam and examined both great saphenous veins. The right is slightly larger than the left with possibly minimal  reflux.      Assessment:     Recent onset pain in bilateral feet-heels and toes-likely due to neuropathy No evidence of severe arterial insufficiency No evidence of severe reflux and bilateral great saphenous veins that would cause this pattern of pain Patient does have bilateral greater saphenous varicosities right greater than left possibly due to mild reflux in bilateral great saphenous veins    Plan:     No intervention indicated arterial or venous system Need to keep pressure  off of bilateral heels while patient is immobile and in bed as it appears that he has bilateral pressure sores which may be causing significant amount of the pain in the heels Suspect the toe pain is due to neuropathy bilaterally Return on a when necessary basis

## 2017-01-12 DIAGNOSIS — R296 Repeated falls: Secondary | ICD-10-CM | POA: Diagnosis not present

## 2017-01-12 DIAGNOSIS — R41841 Cognitive communication deficit: Secondary | ICD-10-CM | POA: Diagnosis not present

## 2017-01-12 DIAGNOSIS — R471 Dysarthria and anarthria: Secondary | ICD-10-CM | POA: Diagnosis not present

## 2017-01-12 DIAGNOSIS — G20 Parkinson's disease: Secondary | ICD-10-CM | POA: Diagnosis not present

## 2017-01-13 DIAGNOSIS — R296 Repeated falls: Secondary | ICD-10-CM | POA: Diagnosis not present

## 2017-01-13 DIAGNOSIS — R41841 Cognitive communication deficit: Secondary | ICD-10-CM | POA: Diagnosis not present

## 2017-01-13 DIAGNOSIS — G20 Parkinson's disease: Secondary | ICD-10-CM | POA: Diagnosis not present

## 2017-01-13 DIAGNOSIS — R471 Dysarthria and anarthria: Secondary | ICD-10-CM | POA: Diagnosis not present

## 2017-01-14 DIAGNOSIS — R471 Dysarthria and anarthria: Secondary | ICD-10-CM | POA: Diagnosis not present

## 2017-01-14 DIAGNOSIS — R41841 Cognitive communication deficit: Secondary | ICD-10-CM | POA: Diagnosis not present

## 2017-01-14 DIAGNOSIS — G20 Parkinson's disease: Secondary | ICD-10-CM | POA: Diagnosis not present

## 2017-01-14 DIAGNOSIS — R296 Repeated falls: Secondary | ICD-10-CM | POA: Diagnosis not present

## 2017-01-15 DIAGNOSIS — R471 Dysarthria and anarthria: Secondary | ICD-10-CM | POA: Diagnosis not present

## 2017-01-15 DIAGNOSIS — R41841 Cognitive communication deficit: Secondary | ICD-10-CM | POA: Diagnosis not present

## 2017-01-15 DIAGNOSIS — R296 Repeated falls: Secondary | ICD-10-CM | POA: Diagnosis not present

## 2017-01-15 DIAGNOSIS — G20 Parkinson's disease: Secondary | ICD-10-CM | POA: Diagnosis not present

## 2017-01-18 DIAGNOSIS — G20 Parkinson's disease: Secondary | ICD-10-CM | POA: Diagnosis not present

## 2017-01-18 DIAGNOSIS — R296 Repeated falls: Secondary | ICD-10-CM | POA: Diagnosis not present

## 2017-01-18 DIAGNOSIS — R471 Dysarthria and anarthria: Secondary | ICD-10-CM | POA: Diagnosis not present

## 2017-01-18 DIAGNOSIS — R41841 Cognitive communication deficit: Secondary | ICD-10-CM | POA: Diagnosis not present

## 2017-01-19 DIAGNOSIS — R41841 Cognitive communication deficit: Secondary | ICD-10-CM | POA: Diagnosis not present

## 2017-01-19 DIAGNOSIS — R296 Repeated falls: Secondary | ICD-10-CM | POA: Diagnosis not present

## 2017-01-19 DIAGNOSIS — R471 Dysarthria and anarthria: Secondary | ICD-10-CM | POA: Diagnosis not present

## 2017-01-19 DIAGNOSIS — G20 Parkinson's disease: Secondary | ICD-10-CM | POA: Diagnosis not present

## 2017-01-21 DIAGNOSIS — R296 Repeated falls: Secondary | ICD-10-CM | POA: Diagnosis not present

## 2017-01-22 DIAGNOSIS — R296 Repeated falls: Secondary | ICD-10-CM | POA: Diagnosis not present

## 2017-01-25 DIAGNOSIS — R296 Repeated falls: Secondary | ICD-10-CM | POA: Diagnosis not present

## 2017-01-26 DIAGNOSIS — R296 Repeated falls: Secondary | ICD-10-CM | POA: Diagnosis not present

## 2017-01-27 DIAGNOSIS — R296 Repeated falls: Secondary | ICD-10-CM | POA: Diagnosis not present

## 2017-01-28 DIAGNOSIS — R296 Repeated falls: Secondary | ICD-10-CM | POA: Diagnosis not present

## 2017-01-29 DIAGNOSIS — G2581 Restless legs syndrome: Secondary | ICD-10-CM | POA: Diagnosis not present

## 2017-01-29 DIAGNOSIS — F332 Major depressive disorder, recurrent severe without psychotic features: Secondary | ICD-10-CM | POA: Diagnosis not present

## 2017-01-29 DIAGNOSIS — I5032 Chronic diastolic (congestive) heart failure: Secondary | ICD-10-CM | POA: Diagnosis not present

## 2017-01-29 DIAGNOSIS — R296 Repeated falls: Secondary | ICD-10-CM | POA: Diagnosis not present

## 2017-02-01 DIAGNOSIS — R296 Repeated falls: Secondary | ICD-10-CM | POA: Diagnosis not present

## 2017-02-02 DIAGNOSIS — R296 Repeated falls: Secondary | ICD-10-CM | POA: Diagnosis not present

## 2017-02-03 DIAGNOSIS — R296 Repeated falls: Secondary | ICD-10-CM | POA: Diagnosis not present

## 2017-02-04 DIAGNOSIS — R296 Repeated falls: Secondary | ICD-10-CM | POA: Diagnosis not present

## 2017-02-09 DIAGNOSIS — E7800 Pure hypercholesterolemia, unspecified: Secondary | ICD-10-CM | POA: Diagnosis not present

## 2017-02-13 DIAGNOSIS — M5416 Radiculopathy, lumbar region: Secondary | ICD-10-CM | POA: Diagnosis not present

## 2017-02-13 DIAGNOSIS — G20 Parkinson's disease: Secondary | ICD-10-CM | POA: Diagnosis not present

## 2017-02-13 DIAGNOSIS — F1120 Opioid dependence, uncomplicated: Secondary | ICD-10-CM | POA: Diagnosis not present

## 2017-02-13 DIAGNOSIS — M545 Low back pain: Secondary | ICD-10-CM | POA: Diagnosis not present

## 2017-02-13 DIAGNOSIS — G894 Chronic pain syndrome: Secondary | ICD-10-CM | POA: Diagnosis not present

## 2017-02-16 DIAGNOSIS — R209 Unspecified disturbances of skin sensation: Secondary | ICD-10-CM | POA: Diagnosis not present

## 2017-02-18 DIAGNOSIS — G2581 Restless legs syndrome: Secondary | ICD-10-CM | POA: Diagnosis not present

## 2017-02-18 DIAGNOSIS — M79671 Pain in right foot: Secondary | ICD-10-CM | POA: Diagnosis not present

## 2017-02-18 DIAGNOSIS — L89622 Pressure ulcer of left heel, stage 2: Secondary | ICD-10-CM | POA: Diagnosis not present

## 2017-02-18 DIAGNOSIS — M792 Neuralgia and neuritis, unspecified: Secondary | ICD-10-CM | POA: Diagnosis not present

## 2017-03-02 ENCOUNTER — Other Ambulatory Visit (HOSPITAL_COMMUNITY): Payer: Self-pay | Admitting: Internal Medicine

## 2017-03-02 DIAGNOSIS — M5416 Radiculopathy, lumbar region: Secondary | ICD-10-CM

## 2017-03-02 DIAGNOSIS — M5414 Radiculopathy, thoracic region: Secondary | ICD-10-CM

## 2017-03-03 ENCOUNTER — Other Ambulatory Visit (HOSPITAL_COMMUNITY): Payer: Self-pay | Admitting: Anesthesiology

## 2017-03-03 DIAGNOSIS — M5410 Radiculopathy, site unspecified: Secondary | ICD-10-CM

## 2017-03-03 DIAGNOSIS — M5136 Other intervertebral disc degeneration, lumbar region: Secondary | ICD-10-CM

## 2017-03-03 DIAGNOSIS — G9009 Other idiopathic peripheral autonomic neuropathy: Secondary | ICD-10-CM

## 2017-03-12 DIAGNOSIS — M4804 Spinal stenosis, thoracic region: Secondary | ICD-10-CM | POA: Diagnosis not present

## 2017-03-12 DIAGNOSIS — M47817 Spondylosis without myelopathy or radiculopathy, lumbosacral region: Secondary | ICD-10-CM | POA: Diagnosis not present

## 2017-03-12 DIAGNOSIS — G9589 Other specified diseases of spinal cord: Secondary | ICD-10-CM | POA: Diagnosis not present

## 2017-03-12 DIAGNOSIS — M48061 Spinal stenosis, lumbar region without neurogenic claudication: Secondary | ICD-10-CM | POA: Diagnosis not present

## 2017-03-12 DIAGNOSIS — M47814 Spondylosis without myelopathy or radiculopathy, thoracic region: Secondary | ICD-10-CM | POA: Diagnosis not present

## 2017-03-12 DIAGNOSIS — Z9889 Other specified postprocedural states: Secondary | ICD-10-CM | POA: Diagnosis not present

## 2017-03-12 DIAGNOSIS — R918 Other nonspecific abnormal finding of lung field: Secondary | ICD-10-CM | POA: Diagnosis not present

## 2017-03-12 DIAGNOSIS — M5416 Radiculopathy, lumbar region: Secondary | ICD-10-CM | POA: Diagnosis not present

## 2017-03-12 DIAGNOSIS — M47816 Spondylosis without myelopathy or radiculopathy, lumbar region: Secondary | ICD-10-CM | POA: Diagnosis not present

## 2017-03-12 DIAGNOSIS — M5414 Radiculopathy, thoracic region: Secondary | ICD-10-CM | POA: Diagnosis not present

## 2017-03-13 DIAGNOSIS — M545 Low back pain: Secondary | ICD-10-CM | POA: Diagnosis not present

## 2017-03-13 DIAGNOSIS — M5416 Radiculopathy, lumbar region: Secondary | ICD-10-CM | POA: Diagnosis not present

## 2017-03-13 DIAGNOSIS — G20 Parkinson's disease: Secondary | ICD-10-CM | POA: Diagnosis not present

## 2017-03-13 DIAGNOSIS — F1120 Opioid dependence, uncomplicated: Secondary | ICD-10-CM | POA: Diagnosis not present

## 2017-03-13 DIAGNOSIS — G894 Chronic pain syndrome: Secondary | ICD-10-CM | POA: Diagnosis not present

## 2017-03-16 DIAGNOSIS — M79671 Pain in right foot: Secondary | ICD-10-CM | POA: Diagnosis not present

## 2017-03-16 DIAGNOSIS — F332 Major depressive disorder, recurrent severe without psychotic features: Secondary | ICD-10-CM | POA: Diagnosis not present

## 2017-03-16 DIAGNOSIS — M792 Neuralgia and neuritis, unspecified: Secondary | ICD-10-CM | POA: Diagnosis not present

## 2017-03-16 DIAGNOSIS — L89622 Pressure ulcer of left heel, stage 2: Secondary | ICD-10-CM | POA: Diagnosis not present

## 2017-03-22 DIAGNOSIS — R41841 Cognitive communication deficit: Secondary | ICD-10-CM | POA: Diagnosis not present

## 2017-03-22 DIAGNOSIS — R296 Repeated falls: Secondary | ICD-10-CM | POA: Diagnosis not present

## 2017-03-22 DIAGNOSIS — Z23 Encounter for immunization: Secondary | ICD-10-CM | POA: Diagnosis not present

## 2017-03-23 DIAGNOSIS — Z23 Encounter for immunization: Secondary | ICD-10-CM | POA: Diagnosis not present

## 2017-03-23 DIAGNOSIS — R296 Repeated falls: Secondary | ICD-10-CM | POA: Diagnosis not present

## 2017-03-23 DIAGNOSIS — R41841 Cognitive communication deficit: Secondary | ICD-10-CM | POA: Diagnosis not present

## 2017-03-24 DIAGNOSIS — R296 Repeated falls: Secondary | ICD-10-CM | POA: Diagnosis not present

## 2017-03-24 DIAGNOSIS — R41841 Cognitive communication deficit: Secondary | ICD-10-CM | POA: Diagnosis not present

## 2017-03-24 DIAGNOSIS — Z23 Encounter for immunization: Secondary | ICD-10-CM | POA: Diagnosis not present

## 2017-03-25 DIAGNOSIS — Z23 Encounter for immunization: Secondary | ICD-10-CM | POA: Diagnosis not present

## 2017-03-25 DIAGNOSIS — R296 Repeated falls: Secondary | ICD-10-CM | POA: Diagnosis not present

## 2017-03-25 DIAGNOSIS — R41841 Cognitive communication deficit: Secondary | ICD-10-CM | POA: Diagnosis not present

## 2017-03-29 DIAGNOSIS — R296 Repeated falls: Secondary | ICD-10-CM | POA: Diagnosis not present

## 2017-03-29 DIAGNOSIS — Z23 Encounter for immunization: Secondary | ICD-10-CM | POA: Diagnosis not present

## 2017-03-29 DIAGNOSIS — R41841 Cognitive communication deficit: Secondary | ICD-10-CM | POA: Diagnosis not present

## 2017-03-30 DIAGNOSIS — R41841 Cognitive communication deficit: Secondary | ICD-10-CM | POA: Diagnosis not present

## 2017-03-30 DIAGNOSIS — R296 Repeated falls: Secondary | ICD-10-CM | POA: Diagnosis not present

## 2017-03-30 DIAGNOSIS — Z23 Encounter for immunization: Secondary | ICD-10-CM | POA: Diagnosis not present

## 2017-03-31 DIAGNOSIS — R296 Repeated falls: Secondary | ICD-10-CM | POA: Diagnosis not present

## 2017-03-31 DIAGNOSIS — Z23 Encounter for immunization: Secondary | ICD-10-CM | POA: Diagnosis not present

## 2017-03-31 DIAGNOSIS — R41841 Cognitive communication deficit: Secondary | ICD-10-CM | POA: Diagnosis not present

## 2017-04-01 DIAGNOSIS — Z23 Encounter for immunization: Secondary | ICD-10-CM | POA: Diagnosis not present

## 2017-04-01 DIAGNOSIS — R296 Repeated falls: Secondary | ICD-10-CM | POA: Diagnosis not present

## 2017-04-01 DIAGNOSIS — R41841 Cognitive communication deficit: Secondary | ICD-10-CM | POA: Diagnosis not present

## 2017-04-02 DIAGNOSIS — Z23 Encounter for immunization: Secondary | ICD-10-CM | POA: Diagnosis not present

## 2017-04-02 DIAGNOSIS — R296 Repeated falls: Secondary | ICD-10-CM | POA: Diagnosis not present

## 2017-04-02 DIAGNOSIS — R41841 Cognitive communication deficit: Secondary | ICD-10-CM | POA: Diagnosis not present

## 2017-04-05 DIAGNOSIS — R296 Repeated falls: Secondary | ICD-10-CM | POA: Diagnosis not present

## 2017-04-05 DIAGNOSIS — Z23 Encounter for immunization: Secondary | ICD-10-CM | POA: Diagnosis not present

## 2017-04-05 DIAGNOSIS — R41841 Cognitive communication deficit: Secondary | ICD-10-CM | POA: Diagnosis not present

## 2017-04-06 DIAGNOSIS — Z23 Encounter for immunization: Secondary | ICD-10-CM | POA: Diagnosis not present

## 2017-04-06 DIAGNOSIS — R41841 Cognitive communication deficit: Secondary | ICD-10-CM | POA: Diagnosis not present

## 2017-04-06 DIAGNOSIS — R296 Repeated falls: Secondary | ICD-10-CM | POA: Diagnosis not present

## 2017-04-07 DIAGNOSIS — R41841 Cognitive communication deficit: Secondary | ICD-10-CM | POA: Diagnosis not present

## 2017-04-07 DIAGNOSIS — R296 Repeated falls: Secondary | ICD-10-CM | POA: Diagnosis not present

## 2017-04-07 DIAGNOSIS — Z23 Encounter for immunization: Secondary | ICD-10-CM | POA: Diagnosis not present

## 2017-04-08 DIAGNOSIS — R296 Repeated falls: Secondary | ICD-10-CM | POA: Diagnosis not present

## 2017-04-08 DIAGNOSIS — Z23 Encounter for immunization: Secondary | ICD-10-CM | POA: Diagnosis not present

## 2017-04-08 DIAGNOSIS — R41841 Cognitive communication deficit: Secondary | ICD-10-CM | POA: Diagnosis not present

## 2017-04-09 DIAGNOSIS — R41841 Cognitive communication deficit: Secondary | ICD-10-CM | POA: Diagnosis not present

## 2017-04-09 DIAGNOSIS — R296 Repeated falls: Secondary | ICD-10-CM | POA: Diagnosis not present

## 2017-04-09 DIAGNOSIS — Z23 Encounter for immunization: Secondary | ICD-10-CM | POA: Diagnosis not present

## 2017-04-12 DIAGNOSIS — R41841 Cognitive communication deficit: Secondary | ICD-10-CM | POA: Diagnosis not present

## 2017-04-12 DIAGNOSIS — Z23 Encounter for immunization: Secondary | ICD-10-CM | POA: Diagnosis not present

## 2017-04-12 DIAGNOSIS — R296 Repeated falls: Secondary | ICD-10-CM | POA: Diagnosis not present

## 2017-04-13 DIAGNOSIS — Z23 Encounter for immunization: Secondary | ICD-10-CM | POA: Diagnosis not present

## 2017-04-13 DIAGNOSIS — R41841 Cognitive communication deficit: Secondary | ICD-10-CM | POA: Diagnosis not present

## 2017-04-13 DIAGNOSIS — R296 Repeated falls: Secondary | ICD-10-CM | POA: Diagnosis not present

## 2017-04-14 DIAGNOSIS — R41841 Cognitive communication deficit: Secondary | ICD-10-CM | POA: Diagnosis not present

## 2017-04-14 DIAGNOSIS — Z23 Encounter for immunization: Secondary | ICD-10-CM | POA: Diagnosis not present

## 2017-04-14 DIAGNOSIS — R296 Repeated falls: Secondary | ICD-10-CM | POA: Diagnosis not present

## 2017-04-15 DIAGNOSIS — R0989 Other specified symptoms and signs involving the circulatory and respiratory systems: Secondary | ICD-10-CM | POA: Diagnosis not present

## 2017-04-15 DIAGNOSIS — R05 Cough: Secondary | ICD-10-CM | POA: Diagnosis not present

## 2017-04-16 DIAGNOSIS — Z23 Encounter for immunization: Secondary | ICD-10-CM | POA: Diagnosis not present

## 2017-04-16 DIAGNOSIS — R41841 Cognitive communication deficit: Secondary | ICD-10-CM | POA: Diagnosis not present

## 2017-04-16 DIAGNOSIS — R296 Repeated falls: Secondary | ICD-10-CM | POA: Diagnosis not present

## 2017-04-17 DIAGNOSIS — M5416 Radiculopathy, lumbar region: Secondary | ICD-10-CM | POA: Diagnosis not present

## 2017-04-17 DIAGNOSIS — G894 Chronic pain syndrome: Secondary | ICD-10-CM | POA: Diagnosis not present

## 2017-04-17 DIAGNOSIS — F1120 Opioid dependence, uncomplicated: Secondary | ICD-10-CM | POA: Diagnosis not present

## 2017-04-17 DIAGNOSIS — M545 Low back pain: Secondary | ICD-10-CM | POA: Diagnosis not present

## 2017-04-17 DIAGNOSIS — G20 Parkinson's disease: Secondary | ICD-10-CM | POA: Diagnosis not present

## 2017-04-19 DIAGNOSIS — E1140 Type 2 diabetes mellitus with diabetic neuropathy, unspecified: Secondary | ICD-10-CM | POA: Diagnosis not present

## 2017-04-19 DIAGNOSIS — R41841 Cognitive communication deficit: Secondary | ICD-10-CM | POA: Diagnosis not present

## 2017-04-19 DIAGNOSIS — B351 Tinea unguium: Secondary | ICD-10-CM | POA: Diagnosis not present

## 2017-04-19 DIAGNOSIS — R296 Repeated falls: Secondary | ICD-10-CM | POA: Diagnosis not present

## 2017-04-19 DIAGNOSIS — Z7984 Long term (current) use of oral hypoglycemic drugs: Secondary | ICD-10-CM | POA: Diagnosis not present

## 2017-04-19 DIAGNOSIS — Z23 Encounter for immunization: Secondary | ICD-10-CM | POA: Diagnosis not present

## 2017-04-19 DIAGNOSIS — E1151 Type 2 diabetes mellitus with diabetic peripheral angiopathy without gangrene: Secondary | ICD-10-CM | POA: Diagnosis not present

## 2017-04-20 DIAGNOSIS — R296 Repeated falls: Secondary | ICD-10-CM | POA: Diagnosis not present

## 2017-04-20 DIAGNOSIS — Z79899 Other long term (current) drug therapy: Secondary | ICD-10-CM | POA: Diagnosis not present

## 2017-04-20 DIAGNOSIS — R41841 Cognitive communication deficit: Secondary | ICD-10-CM | POA: Diagnosis not present

## 2017-04-20 DIAGNOSIS — Z23 Encounter for immunization: Secondary | ICD-10-CM | POA: Diagnosis not present

## 2017-04-21 DIAGNOSIS — R296 Repeated falls: Secondary | ICD-10-CM | POA: Diagnosis not present

## 2017-04-21 DIAGNOSIS — Z23 Encounter for immunization: Secondary | ICD-10-CM | POA: Diagnosis not present

## 2017-04-21 DIAGNOSIS — R41841 Cognitive communication deficit: Secondary | ICD-10-CM | POA: Diagnosis not present

## 2017-04-21 DIAGNOSIS — M4727 Other spondylosis with radiculopathy, lumbosacral region: Secondary | ICD-10-CM | POA: Diagnosis not present

## 2017-04-22 DIAGNOSIS — R296 Repeated falls: Secondary | ICD-10-CM | POA: Diagnosis not present

## 2017-04-23 DIAGNOSIS — R296 Repeated falls: Secondary | ICD-10-CM | POA: Diagnosis not present

## 2017-04-26 DIAGNOSIS — R296 Repeated falls: Secondary | ICD-10-CM | POA: Diagnosis not present

## 2017-04-27 DIAGNOSIS — R296 Repeated falls: Secondary | ICD-10-CM | POA: Diagnosis not present

## 2017-04-28 DIAGNOSIS — R296 Repeated falls: Secondary | ICD-10-CM | POA: Diagnosis not present

## 2017-04-29 DIAGNOSIS — R296 Repeated falls: Secondary | ICD-10-CM | POA: Diagnosis not present

## 2017-04-30 DIAGNOSIS — R296 Repeated falls: Secondary | ICD-10-CM | POA: Diagnosis not present

## 2017-05-03 DIAGNOSIS — R296 Repeated falls: Secondary | ICD-10-CM | POA: Diagnosis not present

## 2017-05-04 DIAGNOSIS — R296 Repeated falls: Secondary | ICD-10-CM | POA: Diagnosis not present

## 2017-05-05 DIAGNOSIS — M79671 Pain in right foot: Secondary | ICD-10-CM | POA: Diagnosis not present

## 2017-05-05 DIAGNOSIS — L89622 Pressure ulcer of left heel, stage 2: Secondary | ICD-10-CM | POA: Diagnosis not present

## 2017-05-05 DIAGNOSIS — R296 Repeated falls: Secondary | ICD-10-CM | POA: Diagnosis not present

## 2017-05-05 DIAGNOSIS — F332 Major depressive disorder, recurrent severe without psychotic features: Secondary | ICD-10-CM | POA: Diagnosis not present

## 2017-05-05 DIAGNOSIS — M792 Neuralgia and neuritis, unspecified: Secondary | ICD-10-CM | POA: Diagnosis not present

## 2017-05-06 DIAGNOSIS — R296 Repeated falls: Secondary | ICD-10-CM | POA: Diagnosis not present

## 2017-05-07 DIAGNOSIS — R296 Repeated falls: Secondary | ICD-10-CM | POA: Diagnosis not present

## 2017-05-09 DIAGNOSIS — R296 Repeated falls: Secondary | ICD-10-CM | POA: Diagnosis not present

## 2017-05-10 DIAGNOSIS — R296 Repeated falls: Secondary | ICD-10-CM | POA: Diagnosis not present

## 2017-05-11 DIAGNOSIS — R296 Repeated falls: Secondary | ICD-10-CM | POA: Diagnosis not present

## 2017-05-12 DIAGNOSIS — R296 Repeated falls: Secondary | ICD-10-CM | POA: Diagnosis not present

## 2017-05-14 DIAGNOSIS — R296 Repeated falls: Secondary | ICD-10-CM | POA: Diagnosis not present

## 2017-05-17 DIAGNOSIS — R296 Repeated falls: Secondary | ICD-10-CM | POA: Diagnosis not present

## 2017-05-18 DIAGNOSIS — M4727 Other spondylosis with radiculopathy, lumbosacral region: Secondary | ICD-10-CM | POA: Diagnosis not present

## 2017-05-18 DIAGNOSIS — M5417 Radiculopathy, lumbosacral region: Secondary | ICD-10-CM | POA: Diagnosis not present

## 2017-05-18 DIAGNOSIS — R296 Repeated falls: Secondary | ICD-10-CM | POA: Diagnosis not present

## 2017-05-19 DIAGNOSIS — R296 Repeated falls: Secondary | ICD-10-CM | POA: Diagnosis not present

## 2017-05-20 DIAGNOSIS — R296 Repeated falls: Secondary | ICD-10-CM | POA: Diagnosis not present

## 2017-05-21 DIAGNOSIS — R296 Repeated falls: Secondary | ICD-10-CM | POA: Diagnosis not present

## 2017-05-23 DIAGNOSIS — M5416 Radiculopathy, lumbar region: Secondary | ICD-10-CM | POA: Diagnosis not present

## 2017-05-23 DIAGNOSIS — G20 Parkinson's disease: Secondary | ICD-10-CM | POA: Diagnosis not present

## 2017-05-23 DIAGNOSIS — F1120 Opioid dependence, uncomplicated: Secondary | ICD-10-CM | POA: Diagnosis not present

## 2017-05-23 DIAGNOSIS — G894 Chronic pain syndrome: Secondary | ICD-10-CM | POA: Diagnosis not present

## 2017-05-23 DIAGNOSIS — M545 Low back pain: Secondary | ICD-10-CM | POA: Diagnosis not present

## 2017-05-24 DIAGNOSIS — R296 Repeated falls: Secondary | ICD-10-CM | POA: Diagnosis not present

## 2017-05-25 DIAGNOSIS — R296 Repeated falls: Secondary | ICD-10-CM | POA: Diagnosis not present

## 2017-05-26 DIAGNOSIS — R296 Repeated falls: Secondary | ICD-10-CM | POA: Diagnosis not present

## 2017-05-27 DIAGNOSIS — R296 Repeated falls: Secondary | ICD-10-CM | POA: Diagnosis not present

## 2017-05-29 DIAGNOSIS — R296 Repeated falls: Secondary | ICD-10-CM | POA: Diagnosis not present

## 2017-06-04 DIAGNOSIS — L600 Ingrowing nail: Secondary | ICD-10-CM | POA: Diagnosis not present

## 2017-06-04 DIAGNOSIS — I739 Peripheral vascular disease, unspecified: Secondary | ICD-10-CM | POA: Diagnosis not present

## 2017-06-26 DIAGNOSIS — G894 Chronic pain syndrome: Secondary | ICD-10-CM | POA: Diagnosis not present

## 2017-06-26 DIAGNOSIS — M545 Low back pain: Secondary | ICD-10-CM | POA: Diagnosis not present

## 2017-06-26 DIAGNOSIS — G20 Parkinson's disease: Secondary | ICD-10-CM | POA: Diagnosis not present

## 2017-06-26 DIAGNOSIS — M5416 Radiculopathy, lumbar region: Secondary | ICD-10-CM | POA: Diagnosis not present

## 2017-06-26 DIAGNOSIS — F1120 Opioid dependence, uncomplicated: Secondary | ICD-10-CM | POA: Diagnosis not present

## 2017-06-30 DIAGNOSIS — L89622 Pressure ulcer of left heel, stage 2: Secondary | ICD-10-CM | POA: Diagnosis not present

## 2017-06-30 DIAGNOSIS — M792 Neuralgia and neuritis, unspecified: Secondary | ICD-10-CM | POA: Diagnosis not present

## 2017-06-30 DIAGNOSIS — M79671 Pain in right foot: Secondary | ICD-10-CM | POA: Diagnosis not present

## 2017-06-30 DIAGNOSIS — I5032 Chronic diastolic (congestive) heart failure: Secondary | ICD-10-CM | POA: Diagnosis not present

## 2017-07-01 DIAGNOSIS — M792 Neuralgia and neuritis, unspecified: Secondary | ICD-10-CM | POA: Diagnosis not present

## 2017-07-01 DIAGNOSIS — M79671 Pain in right foot: Secondary | ICD-10-CM | POA: Diagnosis not present

## 2017-07-01 DIAGNOSIS — L89622 Pressure ulcer of left heel, stage 2: Secondary | ICD-10-CM | POA: Diagnosis not present

## 2017-07-01 DIAGNOSIS — G4700 Insomnia, unspecified: Secondary | ICD-10-CM | POA: Diagnosis not present

## 2017-08-13 DIAGNOSIS — N401 Enlarged prostate with lower urinary tract symptoms: Secondary | ICD-10-CM | POA: Diagnosis not present

## 2017-08-13 DIAGNOSIS — M792 Neuralgia and neuritis, unspecified: Secondary | ICD-10-CM | POA: Diagnosis not present

## 2017-08-13 DIAGNOSIS — L89622 Pressure ulcer of left heel, stage 2: Secondary | ICD-10-CM | POA: Diagnosis not present

## 2017-08-13 DIAGNOSIS — M79671 Pain in right foot: Secondary | ICD-10-CM | POA: Diagnosis not present

## 2017-08-17 DIAGNOSIS — L603 Nail dystrophy: Secondary | ICD-10-CM | POA: Diagnosis not present

## 2017-08-17 DIAGNOSIS — I739 Peripheral vascular disease, unspecified: Secondary | ICD-10-CM | POA: Diagnosis not present

## 2017-08-17 DIAGNOSIS — B351 Tinea unguium: Secondary | ICD-10-CM | POA: Diagnosis not present

## 2017-09-21 DIAGNOSIS — M79671 Pain in right foot: Secondary | ICD-10-CM | POA: Diagnosis not present

## 2017-09-21 DIAGNOSIS — F332 Major depressive disorder, recurrent severe without psychotic features: Secondary | ICD-10-CM | POA: Diagnosis not present

## 2017-09-21 DIAGNOSIS — L89622 Pressure ulcer of left heel, stage 2: Secondary | ICD-10-CM | POA: Diagnosis not present

## 2017-09-21 DIAGNOSIS — M792 Neuralgia and neuritis, unspecified: Secondary | ICD-10-CM | POA: Diagnosis not present

## 2017-09-28 DIAGNOSIS — M6281 Muscle weakness (generalized): Secondary | ICD-10-CM | POA: Diagnosis not present

## 2017-09-28 DIAGNOSIS — Z79899 Other long term (current) drug therapy: Secondary | ICD-10-CM | POA: Diagnosis not present

## 2017-09-28 DIAGNOSIS — D649 Anemia, unspecified: Secondary | ICD-10-CM | POA: Diagnosis not present

## 2017-10-05 DIAGNOSIS — M5417 Radiculopathy, lumbosacral region: Secondary | ICD-10-CM | POA: Diagnosis not present

## 2017-10-05 DIAGNOSIS — M4727 Other spondylosis with radiculopathy, lumbosacral region: Secondary | ICD-10-CM | POA: Diagnosis not present

## 2017-10-12 ENCOUNTER — Encounter: Payer: Self-pay | Admitting: Neurology

## 2017-10-12 DIAGNOSIS — M6281 Muscle weakness (generalized): Secondary | ICD-10-CM | POA: Diagnosis not present

## 2017-10-12 DIAGNOSIS — G20 Parkinson's disease: Secondary | ICD-10-CM | POA: Diagnosis not present

## 2017-10-12 DIAGNOSIS — Z79899 Other long term (current) drug therapy: Secondary | ICD-10-CM | POA: Diagnosis not present

## 2017-10-13 DIAGNOSIS — M6281 Muscle weakness (generalized): Secondary | ICD-10-CM | POA: Diagnosis not present

## 2017-10-13 DIAGNOSIS — G20 Parkinson's disease: Secondary | ICD-10-CM | POA: Diagnosis not present

## 2017-10-14 ENCOUNTER — Encounter: Payer: Self-pay | Admitting: Neurology

## 2017-10-15 DIAGNOSIS — M6281 Muscle weakness (generalized): Secondary | ICD-10-CM | POA: Diagnosis not present

## 2017-10-15 DIAGNOSIS — G20 Parkinson's disease: Secondary | ICD-10-CM | POA: Diagnosis not present

## 2017-10-16 DIAGNOSIS — G20 Parkinson's disease: Secondary | ICD-10-CM | POA: Diagnosis not present

## 2017-10-16 DIAGNOSIS — M6281 Muscle weakness (generalized): Secondary | ICD-10-CM | POA: Diagnosis not present

## 2017-10-18 DIAGNOSIS — M6281 Muscle weakness (generalized): Secondary | ICD-10-CM | POA: Diagnosis not present

## 2017-10-18 DIAGNOSIS — G20 Parkinson's disease: Secondary | ICD-10-CM | POA: Diagnosis not present

## 2017-10-19 DIAGNOSIS — L89622 Pressure ulcer of left heel, stage 2: Secondary | ICD-10-CM | POA: Diagnosis not present

## 2017-10-19 DIAGNOSIS — I5032 Chronic diastolic (congestive) heart failure: Secondary | ICD-10-CM | POA: Diagnosis not present

## 2017-10-19 DIAGNOSIS — M79671 Pain in right foot: Secondary | ICD-10-CM | POA: Diagnosis not present

## 2017-10-19 DIAGNOSIS — N401 Enlarged prostate with lower urinary tract symptoms: Secondary | ICD-10-CM | POA: Diagnosis not present

## 2017-10-19 DIAGNOSIS — R296 Repeated falls: Secondary | ICD-10-CM | POA: Diagnosis not present

## 2017-10-19 DIAGNOSIS — M792 Neuralgia and neuritis, unspecified: Secondary | ICD-10-CM | POA: Diagnosis not present

## 2017-10-19 DIAGNOSIS — G20 Parkinson's disease: Secondary | ICD-10-CM | POA: Diagnosis not present

## 2017-10-19 DIAGNOSIS — F332 Major depressive disorder, recurrent severe without psychotic features: Secondary | ICD-10-CM | POA: Diagnosis not present

## 2017-10-20 DIAGNOSIS — M6281 Muscle weakness (generalized): Secondary | ICD-10-CM | POA: Diagnosis not present

## 2017-10-20 DIAGNOSIS — G20 Parkinson's disease: Secondary | ICD-10-CM | POA: Diagnosis not present

## 2017-10-25 DIAGNOSIS — R262 Difficulty in walking, not elsewhere classified: Secondary | ICD-10-CM | POA: Diagnosis not present

## 2017-10-25 DIAGNOSIS — I739 Peripheral vascular disease, unspecified: Secondary | ICD-10-CM | POA: Diagnosis not present

## 2017-10-25 DIAGNOSIS — M4727 Other spondylosis with radiculopathy, lumbosacral region: Secondary | ICD-10-CM | POA: Diagnosis not present

## 2017-10-25 DIAGNOSIS — B351 Tinea unguium: Secondary | ICD-10-CM | POA: Diagnosis not present

## 2017-10-25 DIAGNOSIS — M5417 Radiculopathy, lumbosacral region: Secondary | ICD-10-CM | POA: Diagnosis not present

## 2017-10-27 DIAGNOSIS — M6281 Muscle weakness (generalized): Secondary | ICD-10-CM | POA: Diagnosis not present

## 2017-10-27 DIAGNOSIS — G20 Parkinson's disease: Secondary | ICD-10-CM | POA: Diagnosis not present

## 2017-11-01 DIAGNOSIS — G20 Parkinson's disease: Secondary | ICD-10-CM | POA: Diagnosis not present

## 2017-11-01 DIAGNOSIS — M6281 Muscle weakness (generalized): Secondary | ICD-10-CM | POA: Diagnosis not present

## 2017-11-03 DIAGNOSIS — M6281 Muscle weakness (generalized): Secondary | ICD-10-CM | POA: Diagnosis not present

## 2017-11-03 DIAGNOSIS — G20 Parkinson's disease: Secondary | ICD-10-CM | POA: Diagnosis not present

## 2017-11-04 DIAGNOSIS — G20 Parkinson's disease: Secondary | ICD-10-CM | POA: Diagnosis not present

## 2017-11-04 DIAGNOSIS — M6281 Muscle weakness (generalized): Secondary | ICD-10-CM | POA: Diagnosis not present

## 2017-11-05 DIAGNOSIS — G20 Parkinson's disease: Secondary | ICD-10-CM | POA: Diagnosis not present

## 2017-11-05 DIAGNOSIS — M6281 Muscle weakness (generalized): Secondary | ICD-10-CM | POA: Diagnosis not present

## 2017-11-07 DIAGNOSIS — G20 Parkinson's disease: Secondary | ICD-10-CM | POA: Diagnosis not present

## 2017-11-07 DIAGNOSIS — M6281 Muscle weakness (generalized): Secondary | ICD-10-CM | POA: Diagnosis not present

## 2017-11-08 DIAGNOSIS — M6281 Muscle weakness (generalized): Secondary | ICD-10-CM | POA: Diagnosis not present

## 2017-11-08 DIAGNOSIS — G20 Parkinson's disease: Secondary | ICD-10-CM | POA: Diagnosis not present

## 2017-12-02 DIAGNOSIS — M4727 Other spondylosis with radiculopathy, lumbosacral region: Secondary | ICD-10-CM | POA: Diagnosis not present

## 2017-12-13 DIAGNOSIS — G20 Parkinson's disease: Secondary | ICD-10-CM | POA: Diagnosis not present

## 2017-12-15 DIAGNOSIS — G20 Parkinson's disease: Secondary | ICD-10-CM | POA: Diagnosis not present

## 2017-12-16 DIAGNOSIS — G20 Parkinson's disease: Secondary | ICD-10-CM | POA: Diagnosis not present

## 2017-12-17 DIAGNOSIS — G20 Parkinson's disease: Secondary | ICD-10-CM | POA: Diagnosis not present

## 2017-12-20 DIAGNOSIS — G20 Parkinson's disease: Secondary | ICD-10-CM | POA: Diagnosis not present

## 2017-12-21 DIAGNOSIS — G20 Parkinson's disease: Secondary | ICD-10-CM | POA: Diagnosis not present

## 2017-12-22 DIAGNOSIS — G20 Parkinson's disease: Secondary | ICD-10-CM | POA: Diagnosis not present

## 2017-12-23 DIAGNOSIS — G20 Parkinson's disease: Secondary | ICD-10-CM | POA: Diagnosis not present

## 2017-12-24 DIAGNOSIS — G20 Parkinson's disease: Secondary | ICD-10-CM | POA: Diagnosis not present

## 2017-12-27 DIAGNOSIS — G20 Parkinson's disease: Secondary | ICD-10-CM | POA: Diagnosis not present

## 2017-12-28 DIAGNOSIS — G20 Parkinson's disease: Secondary | ICD-10-CM | POA: Diagnosis not present

## 2017-12-29 DIAGNOSIS — G20 Parkinson's disease: Secondary | ICD-10-CM | POA: Diagnosis not present

## 2017-12-30 DIAGNOSIS — G20 Parkinson's disease: Secondary | ICD-10-CM | POA: Diagnosis not present

## 2017-12-31 DIAGNOSIS — G20 Parkinson's disease: Secondary | ICD-10-CM | POA: Diagnosis not present

## 2018-01-04 DIAGNOSIS — G20 Parkinson's disease: Secondary | ICD-10-CM | POA: Diagnosis not present

## 2018-01-05 DIAGNOSIS — I739 Peripheral vascular disease, unspecified: Secondary | ICD-10-CM | POA: Diagnosis not present

## 2018-01-05 DIAGNOSIS — R262 Difficulty in walking, not elsewhere classified: Secondary | ICD-10-CM | POA: Diagnosis not present

## 2018-01-05 DIAGNOSIS — G20 Parkinson's disease: Secondary | ICD-10-CM | POA: Diagnosis not present

## 2018-01-05 DIAGNOSIS — B351 Tinea unguium: Secondary | ICD-10-CM | POA: Diagnosis not present

## 2018-01-06 DIAGNOSIS — M5417 Radiculopathy, lumbosacral region: Secondary | ICD-10-CM | POA: Diagnosis not present

## 2018-01-06 DIAGNOSIS — G20 Parkinson's disease: Secondary | ICD-10-CM | POA: Diagnosis not present

## 2018-01-06 DIAGNOSIS — M4727 Other spondylosis with radiculopathy, lumbosacral region: Secondary | ICD-10-CM | POA: Diagnosis not present

## 2018-01-07 DIAGNOSIS — G20 Parkinson's disease: Secondary | ICD-10-CM | POA: Diagnosis not present

## 2018-01-11 DIAGNOSIS — G20 Parkinson's disease: Secondary | ICD-10-CM | POA: Diagnosis not present

## 2018-01-12 DIAGNOSIS — G20 Parkinson's disease: Secondary | ICD-10-CM | POA: Diagnosis not present

## 2018-01-13 DIAGNOSIS — G20 Parkinson's disease: Secondary | ICD-10-CM | POA: Diagnosis not present

## 2018-01-14 DIAGNOSIS — G20 Parkinson's disease: Secondary | ICD-10-CM | POA: Diagnosis not present

## 2018-01-18 DIAGNOSIS — G20 Parkinson's disease: Secondary | ICD-10-CM | POA: Diagnosis not present

## 2018-01-19 DIAGNOSIS — G20 Parkinson's disease: Secondary | ICD-10-CM | POA: Diagnosis not present

## 2018-01-20 DIAGNOSIS — M6281 Muscle weakness (generalized): Secondary | ICD-10-CM | POA: Diagnosis not present

## 2018-01-20 DIAGNOSIS — G20 Parkinson's disease: Secondary | ICD-10-CM | POA: Diagnosis not present

## 2018-01-20 DIAGNOSIS — R296 Repeated falls: Secondary | ICD-10-CM | POA: Diagnosis not present

## 2018-01-20 DIAGNOSIS — R41841 Cognitive communication deficit: Secondary | ICD-10-CM | POA: Diagnosis not present

## 2018-01-21 DIAGNOSIS — M533 Sacrococcygeal disorders, not elsewhere classified: Secondary | ICD-10-CM | POA: Diagnosis not present

## 2018-01-21 DIAGNOSIS — W19XXXA Unspecified fall, initial encounter: Secondary | ICD-10-CM | POA: Diagnosis not present

## 2018-01-21 DIAGNOSIS — M545 Low back pain: Secondary | ICD-10-CM | POA: Diagnosis not present

## 2018-01-26 DIAGNOSIS — G20 Parkinson's disease: Secondary | ICD-10-CM | POA: Diagnosis not present

## 2018-01-26 DIAGNOSIS — R296 Repeated falls: Secondary | ICD-10-CM | POA: Diagnosis not present

## 2018-01-26 DIAGNOSIS — R41841 Cognitive communication deficit: Secondary | ICD-10-CM | POA: Diagnosis not present

## 2018-01-26 DIAGNOSIS — M6281 Muscle weakness (generalized): Secondary | ICD-10-CM | POA: Diagnosis not present

## 2018-01-27 DIAGNOSIS — R41841 Cognitive communication deficit: Secondary | ICD-10-CM | POA: Diagnosis not present

## 2018-01-27 DIAGNOSIS — M6281 Muscle weakness (generalized): Secondary | ICD-10-CM | POA: Diagnosis not present

## 2018-01-27 DIAGNOSIS — G20 Parkinson's disease: Secondary | ICD-10-CM | POA: Diagnosis not present

## 2018-01-27 DIAGNOSIS — R296 Repeated falls: Secondary | ICD-10-CM | POA: Diagnosis not present

## 2018-01-28 DIAGNOSIS — R41841 Cognitive communication deficit: Secondary | ICD-10-CM | POA: Diagnosis not present

## 2018-01-28 DIAGNOSIS — M6281 Muscle weakness (generalized): Secondary | ICD-10-CM | POA: Diagnosis not present

## 2018-01-28 DIAGNOSIS — R296 Repeated falls: Secondary | ICD-10-CM | POA: Diagnosis not present

## 2018-01-28 DIAGNOSIS — G20 Parkinson's disease: Secondary | ICD-10-CM | POA: Diagnosis not present

## 2018-01-29 DIAGNOSIS — R41841 Cognitive communication deficit: Secondary | ICD-10-CM | POA: Diagnosis not present

## 2018-01-29 DIAGNOSIS — M6281 Muscle weakness (generalized): Secondary | ICD-10-CM | POA: Diagnosis not present

## 2018-01-29 DIAGNOSIS — G20 Parkinson's disease: Secondary | ICD-10-CM | POA: Diagnosis not present

## 2018-01-29 DIAGNOSIS — R296 Repeated falls: Secondary | ICD-10-CM | POA: Diagnosis not present

## 2018-01-30 DIAGNOSIS — G20 Parkinson's disease: Secondary | ICD-10-CM | POA: Diagnosis not present

## 2018-01-30 DIAGNOSIS — M6281 Muscle weakness (generalized): Secondary | ICD-10-CM | POA: Diagnosis not present

## 2018-01-30 DIAGNOSIS — R296 Repeated falls: Secondary | ICD-10-CM | POA: Diagnosis not present

## 2018-01-30 DIAGNOSIS — R41841 Cognitive communication deficit: Secondary | ICD-10-CM | POA: Diagnosis not present

## 2018-01-31 DIAGNOSIS — R41841 Cognitive communication deficit: Secondary | ICD-10-CM | POA: Diagnosis not present

## 2018-01-31 DIAGNOSIS — M6281 Muscle weakness (generalized): Secondary | ICD-10-CM | POA: Diagnosis not present

## 2018-01-31 DIAGNOSIS — R296 Repeated falls: Secondary | ICD-10-CM | POA: Diagnosis not present

## 2018-01-31 DIAGNOSIS — G20 Parkinson's disease: Secondary | ICD-10-CM | POA: Diagnosis not present

## 2018-02-01 DIAGNOSIS — M79671 Pain in right foot: Secondary | ICD-10-CM | POA: Diagnosis not present

## 2018-02-01 DIAGNOSIS — L89622 Pressure ulcer of left heel, stage 2: Secondary | ICD-10-CM | POA: Diagnosis not present

## 2018-02-01 DIAGNOSIS — M792 Neuralgia and neuritis, unspecified: Secondary | ICD-10-CM | POA: Diagnosis not present

## 2018-02-01 DIAGNOSIS — N401 Enlarged prostate with lower urinary tract symptoms: Secondary | ICD-10-CM | POA: Diagnosis not present

## 2018-02-03 ENCOUNTER — Ambulatory Visit: Payer: Medicare HMO | Admitting: Neurology

## 2018-02-03 DIAGNOSIS — G20 Parkinson's disease: Secondary | ICD-10-CM | POA: Diagnosis not present

## 2018-02-03 DIAGNOSIS — R41841 Cognitive communication deficit: Secondary | ICD-10-CM | POA: Diagnosis not present

## 2018-02-03 DIAGNOSIS — M6281 Muscle weakness (generalized): Secondary | ICD-10-CM | POA: Diagnosis not present

## 2018-02-03 DIAGNOSIS — R296 Repeated falls: Secondary | ICD-10-CM | POA: Diagnosis not present

## 2018-02-04 DIAGNOSIS — R41841 Cognitive communication deficit: Secondary | ICD-10-CM | POA: Diagnosis not present

## 2018-02-04 DIAGNOSIS — G20 Parkinson's disease: Secondary | ICD-10-CM | POA: Diagnosis not present

## 2018-02-04 DIAGNOSIS — M6281 Muscle weakness (generalized): Secondary | ICD-10-CM | POA: Diagnosis not present

## 2018-02-04 DIAGNOSIS — R296 Repeated falls: Secondary | ICD-10-CM | POA: Diagnosis not present

## 2018-02-05 DIAGNOSIS — G20 Parkinson's disease: Secondary | ICD-10-CM | POA: Diagnosis not present

## 2018-02-05 DIAGNOSIS — M6281 Muscle weakness (generalized): Secondary | ICD-10-CM | POA: Diagnosis not present

## 2018-02-05 DIAGNOSIS — R296 Repeated falls: Secondary | ICD-10-CM | POA: Diagnosis not present

## 2018-02-05 DIAGNOSIS — R41841 Cognitive communication deficit: Secondary | ICD-10-CM | POA: Diagnosis not present

## 2018-02-06 DIAGNOSIS — R296 Repeated falls: Secondary | ICD-10-CM | POA: Diagnosis not present

## 2018-02-06 DIAGNOSIS — G20 Parkinson's disease: Secondary | ICD-10-CM | POA: Diagnosis not present

## 2018-02-06 DIAGNOSIS — M6281 Muscle weakness (generalized): Secondary | ICD-10-CM | POA: Diagnosis not present

## 2018-02-06 DIAGNOSIS — R41841 Cognitive communication deficit: Secondary | ICD-10-CM | POA: Diagnosis not present

## 2018-02-07 DIAGNOSIS — R030 Elevated blood-pressure reading, without diagnosis of hypertension: Secondary | ICD-10-CM | POA: Diagnosis not present

## 2018-02-07 DIAGNOSIS — G20 Parkinson's disease: Secondary | ICD-10-CM | POA: Diagnosis not present

## 2018-02-07 DIAGNOSIS — M6281 Muscle weakness (generalized): Secondary | ICD-10-CM | POA: Diagnosis not present

## 2018-02-07 DIAGNOSIS — R296 Repeated falls: Secondary | ICD-10-CM | POA: Diagnosis not present

## 2018-02-07 DIAGNOSIS — M79672 Pain in left foot: Secondary | ICD-10-CM | POA: Diagnosis not present

## 2018-02-07 DIAGNOSIS — M4727 Other spondylosis with radiculopathy, lumbosacral region: Secondary | ICD-10-CM | POA: Diagnosis not present

## 2018-02-07 DIAGNOSIS — R41841 Cognitive communication deficit: Secondary | ICD-10-CM | POA: Diagnosis not present

## 2018-02-07 DIAGNOSIS — Z6826 Body mass index (BMI) 26.0-26.9, adult: Secondary | ICD-10-CM | POA: Diagnosis not present

## 2018-02-08 DIAGNOSIS — M24541 Contracture, right hand: Secondary | ICD-10-CM | POA: Diagnosis not present

## 2018-02-09 DIAGNOSIS — R296 Repeated falls: Secondary | ICD-10-CM | POA: Diagnosis not present

## 2018-02-09 DIAGNOSIS — M6281 Muscle weakness (generalized): Secondary | ICD-10-CM | POA: Diagnosis not present

## 2018-02-09 DIAGNOSIS — G20 Parkinson's disease: Secondary | ICD-10-CM | POA: Diagnosis not present

## 2018-02-09 DIAGNOSIS — R41841 Cognitive communication deficit: Secondary | ICD-10-CM | POA: Diagnosis not present

## 2018-02-10 DIAGNOSIS — R41841 Cognitive communication deficit: Secondary | ICD-10-CM | POA: Diagnosis not present

## 2018-02-10 DIAGNOSIS — R296 Repeated falls: Secondary | ICD-10-CM | POA: Diagnosis not present

## 2018-02-10 DIAGNOSIS — M6281 Muscle weakness (generalized): Secondary | ICD-10-CM | POA: Diagnosis not present

## 2018-02-10 DIAGNOSIS — G20 Parkinson's disease: Secondary | ICD-10-CM | POA: Diagnosis not present

## 2018-02-11 DIAGNOSIS — M6281 Muscle weakness (generalized): Secondary | ICD-10-CM | POA: Diagnosis not present

## 2018-02-11 DIAGNOSIS — R296 Repeated falls: Secondary | ICD-10-CM | POA: Diagnosis not present

## 2018-02-11 DIAGNOSIS — G20 Parkinson's disease: Secondary | ICD-10-CM | POA: Diagnosis not present

## 2018-02-11 DIAGNOSIS — R41841 Cognitive communication deficit: Secondary | ICD-10-CM | POA: Diagnosis not present

## 2018-02-14 DIAGNOSIS — R296 Repeated falls: Secondary | ICD-10-CM | POA: Diagnosis not present

## 2018-02-14 DIAGNOSIS — R41841 Cognitive communication deficit: Secondary | ICD-10-CM | POA: Diagnosis not present

## 2018-02-14 DIAGNOSIS — G20 Parkinson's disease: Secondary | ICD-10-CM | POA: Diagnosis not present

## 2018-02-14 DIAGNOSIS — M6281 Muscle weakness (generalized): Secondary | ICD-10-CM | POA: Diagnosis not present

## 2018-02-16 DIAGNOSIS — G20 Parkinson's disease: Secondary | ICD-10-CM | POA: Diagnosis not present

## 2018-02-16 DIAGNOSIS — R41841 Cognitive communication deficit: Secondary | ICD-10-CM | POA: Diagnosis not present

## 2018-02-16 DIAGNOSIS — M6281 Muscle weakness (generalized): Secondary | ICD-10-CM | POA: Diagnosis not present

## 2018-02-16 DIAGNOSIS — R296 Repeated falls: Secondary | ICD-10-CM | POA: Diagnosis not present

## 2018-02-16 NOTE — Progress Notes (Signed)
Julian Ramirez was seen today in the movement disorders clinic for neurologic consultation at the request of [facility], [facility].  The consultation is for the evaluation of Parkinsons and syncope.  Records sent from the facility are reviewed, but there are no records regarding Parkinson's disease or syncope. Care everywhere was queried but no notes from surrounding health systems.  Patient was seen by Dr. Merlene Laughter in 2015 while admitted to the hospital.  2015 notes from Dr. Merlene Laughter indicate that the patient was diagnosed with Parkinson's disease in the mid 2014. He reports that he was seen at the [facility] in [place] previously for PD.   It appears that he started on amantadine first, but it caused dry mouth.  It appears that the patient was started on levodopa during that hospitalization.  SNF records indicate he is on carbidopa/levodopa 50/200 tid.  He doesn't know what time it is given, although does know what time he gets meds in the day but not sure when specifically carbidopa/levodopa is given.  Doesn't think that was always on the 50/200 and doesn't know when changed.   Specific Symptoms:  Tremor: Yes.  , R arm tremor over the years that has gotten worse Family hx of similar:  No. Voice: weaker Sleep: sleeps well with med  Vivid Dreams:  No.  Acting out dreams:  No. Wet Pillows: No. Postural symptoms:  Yes.  , hasn't walked lately due to chronic hip/back pain.  Previously walked with walker  Falls?  Yes.   , one fall recently and slipped off of the chair onto the floor Bradykinesia symptoms: shuffling gait, slow movements and difficulty getting out of a chair Loss of smell:  unknown Loss of taste:  No. Urinary Incontinence:  No. Difficulty Swallowing:  No. Trouble with ADL's:  Yes.    Trouble buttoning clothing: Yes.   Depression:  No. but "I am frustrated." Memory changes:  No. Hallucinations:  No.  visual distortions: No. N/V:  No. Lightheaded:  No.  Syncope: No. - pt  sent here for this but denies Diplopia:  Yes.  , maybe x 1 year.  Can blink it away.   Dyskinesia:  No. Prior exposure to reglan/antipsychotics: No.   PREVIOUS MEDICATIONS: Sinemet  ALLERGIES:   Allergies  Allergen Reactions  . Penicillins Shortness Of Breath and Swelling    Mouth swells   . Peanuts [Peanut Oil] Swelling    Causes swelling in the lower body areas.   . Wheat Bran Swelling    Wheat four causes swelling in the lower body areas     CURRENT MEDICATIONS:  Outpatient Encounter Medications as of 02/17/2018  Medication Sig  . Amino Acids-Protein Hydrolys (FEEDING SUPPLEMENT, PRO-STAT SUGAR FREE 64,) LIQD Take 30 mLs by mouth daily.  . Ascorbic Acid (VITAMIN C) 100 MG tablet Take 100 mg by mouth daily.  . Baclofen 5 MG TABS Take 5 mg by mouth 2 (two) times daily.  . DULoxetine (CYMBALTA) 30 MG capsule Take 30 mg by mouth daily.  . finasteride (PROSCAR) 5 MG tablet Take 5 mg by mouth daily.  . furosemide (LASIX) 20 MG tablet Take by mouth. 3 in the morning, 2 in the evening  . Menthol (ICY HOT) 5 % PTCH Apply topically.  . Oxycodone HCl 10 MG TABS Take 10 mg by mouth 2 (two) times daily.  . polyethylene glycol (MIRALAX / GLYCOLAX) packet Take 17 g by mouth daily.  . potassium chloride SA (K-DUR,KLOR-CON) 20 MEQ tablet Take 20  mEq by mouth daily.  . pregabalin (LYRICA) 100 MG capsule Take 100 mg by mouth 3 (three) times daily.  . tamsulosin (FLOMAX) 0.4 MG CAPS capsule Take 0.4 mg by mouth at bedtime.  . traZODone (DESYREL) 50 MG tablet Take 50 mg by mouth at bedtime.  . [DISCONTINUED] carbidopa-levodopa (SINEMET CR) 50-200 MG tablet Take 1 tablet by mouth 3 (three) times daily.  . [DISCONTINUED] gabapentin (NEURONTIN) 300 MG capsule Take 300 mg by mouth 4 (four) times daily.   . carbidopa-levodopa (SINEMET IR) 25-100 MG tablet Take 2 tablets by mouth 3 (three) times daily. 7 am. 11 am. 4 pm.  . [DISCONTINUED] acetaminophen (TYLENOL) 650 MG CR tablet Take 650 mg by mouth  every 8 (eight) hours as needed for pain.  . [DISCONTINUED] alendronate (FOSAMAX) 70 MG tablet Take 70 mg by mouth once a week. Takes on Wednesday.  Take with a full glass of water on an empty stomach.  . [DISCONTINUED] carbidopa-levodopa (SINEMET IR) 25-100 MG per tablet Take 1 tablet by mouth 3 (three) times daily.  . [DISCONTINUED] cholecalciferol (VITAMIN D) 1000 UNITS tablet Take 1,000 Units by mouth daily.  . [DISCONTINUED] docusate sodium (COLACE) 50 MG capsule Take 150 mg by mouth 2 (two) times daily.  . [DISCONTINUED] DULoxetine (CYMBALTA) 60 MG capsule Take 60 mg by mouth daily.  . [DISCONTINUED] ibuprofen (ADVIL,MOTRIN) 400 MG tablet Take 400 mg by mouth every 6 (six) hours as needed.  . [DISCONTINUED] lidocaine (LIDODERM) 5 % Place 1 patch onto the skin every 12 (twelve) hours. Remove & Discard patch within 12 hours or as directed by MD  . [DISCONTINUED] omeprazole (PRILOSEC) 20 MG capsule Take 20 mg by mouth 2 (two) times daily before a meal.   . [DISCONTINUED] oxyCODONE (OXY IR/ROXICODONE) 5 MG immediate release tablet Take 5 mg by mouth every 2 (two) hours as needed for severe pain.  . [DISCONTINUED] simvastatin (ZOCOR) 20 MG tablet Take 20 mg by mouth daily.  . [DISCONTINUED] traMADol (ULTRAM) 50 MG tablet Take 50 mg by mouth daily as needed for moderate pain.   . [DISCONTINUED] vitamin B-12 (CYANOCOBALAMIN) 1000 MCG tablet Take 1,000 mcg by mouth daily.  . [DISCONTINUED] Zinc Sulfate (ZINC-220 PO) Take by mouth daily.   No facility-administered encounter medications on file as of 02/17/2018.     PAST MEDICAL HISTORY:   Past Medical History:  Diagnosis Date  . Chronic pain   . Edema   . Hypertension   . Neuropathy   . Parkinson disease ([place])     PAST SURGICAL HISTORY:   Past Surgical History:  Procedure Laterality Date  . BACK SURGERY    . BRAIN SURGERY    . CORONARY ANGIOPLASTY WITH STENT PLACEMENT      SOCIAL HISTORY:   Social History   Socioeconomic History    . Marital status: Divorced    Spouse name: Not on file  . Number of children: Not on file  . Years of education: Not on file  . Highest education level: Not on file  Occupational History  . Not on file  Social Needs  . Financial resource strain: Not on file  . Food insecurity:    Worry: Not on file    Inability: Not on file  . Transportation needs:    Medical: Not on file    Non-medical: Not on file  Tobacco Use  . Smoking status: Never Smoker  . Smokeless tobacco: Never Used  Substance and Sexual Activity  . Alcohol use: No  .  Drug use: No  . Sexual activity: Not on file  Lifestyle  . Physical activity:    Days per week: Not on file    Minutes per session: Not on file  . Stress: Not on file  Relationships  . Social connections:    Talks on phone: Not on file    Gets together: Not on file    Attends religious service: Not on file    Active member of club or organization: Not on file    Attends meetings of clubs or organizations: Not on file    Relationship status: Not on file  . Intimate partner violence:    Fear of current or ex partner: Not on file    Emotionally abused: Not on file    Physically abused: Not on file    Forced sexual activity: Not on file  Other Topics Concern  . Not on file  Social History Narrative  . Not on file    FAMILY HISTORY:   Family Status  Relation Name Status  . Mother  Deceased  . Father  Deceased  . Sister x2 Deceased  . Brother  Deceased    ROS:  Review of Systems  HENT: Negative.   Eyes: Positive for blurred vision.  Cardiovascular: Positive for leg swelling.  Gastrointestinal: Negative.   Genitourinary: Negative.   Musculoskeletal: Positive for back pain (lower back) and joint pain (bilateral hip pain, L more than R).  Skin: Negative.   Neurological: Positive for tingling (bilateral paresthesias).  Endo/Heme/Allergies: Negative.   Psychiatric/Behavioral: Negative.     PHYSICAL EXAMINATION:    VITALS:   Vitals:    02/17/18 0951  BP: 128/76  Pulse: 72  SpO2: 97%    GEN:  The patient appears stated age and is in NAD. HEENT:  Normocephalic, atraumatic.  The mucous membranes are moist. The superficial temporal arteries are without ropiness or tenderness. CV:  RRR Lungs:  CTAB Neck/HEME:  There are no carotid bruits bilaterally.  Neurological examination:  Orientation: The patient is alert and oriented x3. Fund of knowledge is appropriate.  Recent and remote memory are intact.  Attention and concentration are normal.    Able to name objects and repeat phrases. Cranial nerves: There is good facial symmetry. Pupils are equal round and reactive to light bilaterally. Fundoscopic exam reveals clear margins bilaterally. Extraocular muscles are intact. The visual fields are full to confrontational testing. The speech is fluent and clear. Soft palate rises symmetrically and there is no tongue deviation. Hearing is markedly decreased to conversational tone. Sensation: Sensation is intact to light and pinprick throughout (facial, trunk, extremities). Vibration is decreased distally. There is no extinction with double simultaneous stimulation. There is no sensory dermatomal level identified. Motor: Strength is 5/5 in the bilateral upper and lower extremities.   Shoulder shrug is equal and symmetric.  There is no pronator drift. Deep tendon reflexes: Deep tendon reflexes are 2-/4 at the bilateral biceps, triceps, brachioradialis, patella and achilles. Plantar responses are downgoing bilaterally.  Movement examination: Tone: There is mod increased tone in the LUE/LLE.  Tone normal on the right. Abnormal movements: none Coordination:  There is mild decremation with RAM's, mostly with finger taps and hand opening/closing bilaterally.  Other RAMs were normal Gait and Station: The patient has  difficulty arising out of a deep-seated chair without the use of the hands.  He pushes off of the wheelchair and requires minimal  assistance from the examiner.  He is given a walker.  He has  a stooped posture.  He has a short gait, but shuffles just a little.    ASSESSMENT/PLAN:  1.  idiopathic Parkinson's disease.    -We discussed the diagnosis as well as pathophysiology of the disease.  We discussed treatment options as well as prognostic indicators.  Patient education was provided.  -We discussed that it used to be thought that levodopa would increase risk of melanoma but now it is believed that Parkinsons itself likely increases risk of melanoma. he is to get regular skin checks.  -We decided to change carbidopa/levodopa 50/200 from 1 tablet 3 times per day to carbidopa/levodopa 25/100, 2 tablets at 7am/11am/4pm.  He will let me know should he have any problems.  This is a dose increase by about 30% given poorer bioavailability of the CR.  -We discussed community resources in the area including patient support groups and community exercise programs for PD and pt education was provided to the patient.  He is clearly frustrated by the fact that he cannot exercise at the nursing facility without physical therapy and it sounds like Medicare physical therapy has run out for him.  Not sure that we can really change this on our end, but I did have him meet with our Education officer, museum.  2.  Chronic back and hip pain  -Explained to the patient that I really do not treat this.  It does limit his ability to walk.  3.  Lower extremity edema  -On my examination, there was no pitting edema.  However, I do think he has an upcoming appointment with cardiology (patient was unaware but saw it on referral).  4.  Syncope  -Patient was referred for syncope, but he denies this.  No notes accompany him to suggest that there was a syncopal event.  Patient was alert and oriented x3 today.  5.  Follow-up in 4 to 5 months, sooner should new neurologic issues arise.  Much greater than 50% of this visit was spent in counseling and coordinating care.   Total face to face time:  45 min  Cc:  Caryl Bis, MD

## 2018-02-17 ENCOUNTER — Encounter: Payer: Self-pay | Admitting: Neurology

## 2018-02-17 ENCOUNTER — Encounter: Payer: Self-pay | Admitting: Psychology

## 2018-02-17 ENCOUNTER — Ambulatory Visit (INDEPENDENT_AMBULATORY_CARE_PROVIDER_SITE_OTHER): Payer: Medicare HMO | Admitting: Neurology

## 2018-02-17 VITALS — BP 128/76 | HR 72

## 2018-02-17 DIAGNOSIS — M545 Low back pain: Secondary | ICD-10-CM | POA: Diagnosis not present

## 2018-02-17 DIAGNOSIS — G20 Parkinson's disease: Secondary | ICD-10-CM | POA: Diagnosis not present

## 2018-02-17 DIAGNOSIS — G8929 Other chronic pain: Secondary | ICD-10-CM

## 2018-02-17 MED ORDER — CARBIDOPA-LEVODOPA 25-100 MG PO TABS: 2.0000 | ORAL_TABLET | Freq: Three times a day (TID) | ORAL | 1 refills | Status: AC

## 2018-02-17 NOTE — Progress Notes (Signed)
Met with patient while he was in the clinic today. He lives at [facility]. He vocalized wanting to be more active, however, PT will only work with him for limited periods of time.   I wrote on his notes and visit instructions to the facility for the facility social worker to contact me about patient care and resources to see if anything could be creatively put in place to keep the patient engaged, and help improve his QOL and experience at his LTC facility. If I do not hear back by tomorrow afternoon, I will try to call her. Patient stated that his Grace Isaac [phone] visits him at the facility at least once a week. Patient reports it is best to get information to him through his son.

## 2018-02-17 NOTE — Patient Instructions (Signed)
STOP Carbidopa Levodopa 50/200 CR Start Carbidopa Levodopa 25/100 IR - 2 tablets at 7 am, 2 tablets at 11 am, and 2 tablets at 4 pm.

## 2018-02-18 ENCOUNTER — Telehealth: Payer: Self-pay | Admitting: Psychology

## 2018-02-18 DIAGNOSIS — M6281 Muscle weakness (generalized): Secondary | ICD-10-CM | POA: Diagnosis not present

## 2018-02-18 DIAGNOSIS — G20 Parkinson's disease: Secondary | ICD-10-CM | POA: Diagnosis not present

## 2018-02-18 DIAGNOSIS — R296 Repeated falls: Secondary | ICD-10-CM | POA: Diagnosis not present

## 2018-02-18 DIAGNOSIS — R41841 Cognitive communication deficit: Secondary | ICD-10-CM | POA: Diagnosis not present

## 2018-02-18 NOTE — Telephone Encounter (Signed)
Telephone call with Julieanne Cotton the social worker at the patient's long-term care facility.  She reported that patient is currently not happy with his current living arrangements.  We talked about ways to improve his quality of life as it appears that he wants to be engaged in programs that will help him be strong.  He currently is in physical therapy and occupational therapy right now.  Usually this process last for 7 to 14 days discontinue use and then he will come back onto therapy.  She stated that he has a lot of valid points of wanting more independence although he does not have the family support at home.  I talked with her about the possibility of the patient participating in programs such as the Parkinson's and the heart program that is weekly.  Going out to programs in the community may help keep him engaged, improved some aspects of his quality of life which may improve his satisfaction at his current living situation.  She stated that she thinks that this could be doable.  I will email her a copy of the Parkinson's and the arts program flyer with directions on how to register the patient.

## 2018-02-22 DIAGNOSIS — G20 Parkinson's disease: Secondary | ICD-10-CM | POA: Diagnosis not present

## 2018-02-22 DIAGNOSIS — R296 Repeated falls: Secondary | ICD-10-CM | POA: Diagnosis not present

## 2018-02-23 DIAGNOSIS — G20 Parkinson's disease: Secondary | ICD-10-CM | POA: Diagnosis not present

## 2018-02-23 DIAGNOSIS — R296 Repeated falls: Secondary | ICD-10-CM | POA: Diagnosis not present

## 2018-03-24 DIAGNOSIS — M25552 Pain in left hip: Secondary | ICD-10-CM | POA: Diagnosis not present

## 2018-03-24 DIAGNOSIS — M792 Neuralgia and neuritis, unspecified: Secondary | ICD-10-CM | POA: Diagnosis not present

## 2018-03-24 DIAGNOSIS — G20 Parkinson's disease: Secondary | ICD-10-CM | POA: Diagnosis not present

## 2018-03-24 DIAGNOSIS — R260 Ataxic gait: Secondary | ICD-10-CM | POA: Diagnosis not present

## 2018-04-01 DIAGNOSIS — J189 Pneumonia, unspecified organism: Secondary | ICD-10-CM | POA: Diagnosis not present

## 2018-04-01 DIAGNOSIS — I517 Cardiomegaly: Secondary | ICD-10-CM | POA: Diagnosis not present

## 2018-04-04 DIAGNOSIS — N401 Enlarged prostate with lower urinary tract symptoms: Secondary | ICD-10-CM | POA: Diagnosis not present

## 2018-04-04 DIAGNOSIS — M792 Neuralgia and neuritis, unspecified: Secondary | ICD-10-CM | POA: Diagnosis not present

## 2018-04-04 DIAGNOSIS — M79671 Pain in right foot: Secondary | ICD-10-CM | POA: Diagnosis not present

## 2018-04-04 DIAGNOSIS — Z79899 Other long term (current) drug therapy: Secondary | ICD-10-CM | POA: Diagnosis not present

## 2018-04-04 DIAGNOSIS — L89622 Pressure ulcer of left heel, stage 2: Secondary | ICD-10-CM | POA: Diagnosis not present

## 2018-04-08 DIAGNOSIS — R296 Repeated falls: Secondary | ICD-10-CM | POA: Diagnosis not present

## 2018-04-08 DIAGNOSIS — G20 Parkinson's disease: Secondary | ICD-10-CM | POA: Diagnosis not present

## 2018-04-09 ENCOUNTER — Other Ambulatory Visit
Admission: RE | Admit: 2018-04-09 | Discharge: 2018-04-09 | Disposition: A | Discharge status: Home / Self Care | Payer: Medicare HMO | Source: Other Acute Inpatient Hospital | Attending: Internal Medicine | Admitting: Internal Medicine

## 2018-04-09 DIAGNOSIS — M79632 Pain in left forearm: Secondary | ICD-10-CM | POA: Diagnosis not present

## 2018-04-09 DIAGNOSIS — Z79899 Other long term (current) drug therapy: Secondary | ICD-10-CM | POA: Insufficient documentation

## 2018-04-09 DIAGNOSIS — M79622 Pain in left upper arm: Secondary | ICD-10-CM | POA: Diagnosis not present

## 2018-04-09 DIAGNOSIS — J9811 Atelectasis: Secondary | ICD-10-CM | POA: Diagnosis not present

## 2018-04-09 DIAGNOSIS — D649 Anemia, unspecified: Secondary | ICD-10-CM | POA: Insufficient documentation

## 2018-04-09 LAB — COMPREHENSIVE METABOLIC PANEL
ALBUMIN: 4.1 g/dL (ref 3.5–5.0)
ALK PHOS: 82 U/L (ref 38–126)
AST: 24 U/L (ref 15–41)
Anion gap: 16 — ABNORMAL HIGH (ref 5–15)
BUN: 37 mg/dL — ABNORMAL HIGH (ref 8–23)
CALCIUM: 9.2 mg/dL (ref 8.9–10.3)
CO2: 25 mmol/L (ref 22–32)
CREATININE: 2.43 mg/dL — AB (ref 0.61–1.24)
Chloride: 97 mmol/L — ABNORMAL LOW (ref 98–111)
GFR calc Af Amer: 26 mL/min — ABNORMAL LOW (ref >60)
GFR calc non Af Amer: 22 mL/min — ABNORMAL LOW (ref >60)
GLUCOSE: 108 mg/dL — AB (ref 70–99)
Potassium: 2.9 mmol/L — ABNORMAL LOW (ref 3.5–5.1)
SODIUM: 138 mmol/L (ref 135–145)
TOTAL PROTEIN: 6.6 g/dL (ref 6.5–8.1)
Total Bilirubin: 1.2 mg/dL (ref 0.3–1.2)

## 2018-04-09 LAB — CBC WITH DIFFERENTIAL/PLATELET
Abs Immature Granulocytes: 0.01 10*3/uL (ref 0.00–0.07)
BASOS ABS: 0 10*3/uL (ref 0.0–0.1)
BASOS PCT: 0 %
EOS PCT: 1 %
Eosinophils Absolute: 0.1 10*3/uL (ref 0.0–0.5)
HCT: 33.8 % — ABNORMAL LOW (ref 39.0–52.0)
HEMOGLOBIN: 11.7 g/dL — AB (ref 13.0–17.0)
Immature Granulocytes: 0 %
LYMPHS PCT: 10 %
Lymphs Abs: 0.5 10*3/uL — ABNORMAL LOW (ref 0.7–4.0)
MCH: 29.6 pg (ref 26.0–34.0)
MCHC: 34.6 g/dL (ref 30.0–36.0)
MCV: 85.6 fL (ref 80.0–100.0)
MONO ABS: 0.3 10*3/uL (ref 0.1–1.0)
Monocytes Relative: 7 %
NEUTROS ABS: 3.8 10*3/uL (ref 1.7–7.7)
Neutrophils Relative %: 82 %
PLATELETS: 178 10*3/uL (ref 150–400)
RBC: 3.95 MIL/uL — AB (ref 4.22–5.81)
RDW: 13.2 % (ref 11.5–15.5)
WBC: 4.7 10*3/uL (ref 4.0–10.5)
nRBC: 0 % (ref 0.0–0.2)

## 2018-04-14 DIAGNOSIS — G20 Parkinson's disease: Secondary | ICD-10-CM | POA: Diagnosis not present

## 2018-04-14 DIAGNOSIS — J150 Pneumonia due to Klebsiella pneumoniae: Secondary | ICD-10-CM | POA: Diagnosis not present

## 2018-04-14 DIAGNOSIS — N179 Acute kidney failure, unspecified: Secondary | ICD-10-CM | POA: Diagnosis not present

## 2018-04-14 DIAGNOSIS — E876 Hypokalemia: Secondary | ICD-10-CM | POA: Diagnosis not present

## 2018-04-15 DIAGNOSIS — R4182 Altered mental status, unspecified: Secondary | ICD-10-CM | POA: Diagnosis not present

## 2018-04-15 DIAGNOSIS — G9009 Other idiopathic peripheral autonomic neuropathy: Secondary | ICD-10-CM | POA: Diagnosis not present

## 2018-04-15 DIAGNOSIS — M6281 Muscle weakness (generalized): Secondary | ICD-10-CM | POA: Diagnosis not present

## 2018-04-15 DIAGNOSIS — G20 Parkinson's disease: Secondary | ICD-10-CM | POA: Diagnosis not present

## 2018-04-15 DIAGNOSIS — Z79899 Other long term (current) drug therapy: Secondary | ICD-10-CM | POA: Diagnosis not present

## 2018-04-15 DIAGNOSIS — D649 Anemia, unspecified: Secondary | ICD-10-CM | POA: Diagnosis not present

## 2018-04-18 DIAGNOSIS — G20 Parkinson's disease: Secondary | ICD-10-CM | POA: Diagnosis not present

## 2018-04-18 DIAGNOSIS — M6281 Muscle weakness (generalized): Secondary | ICD-10-CM | POA: Diagnosis not present

## 2018-04-18 DIAGNOSIS — Z79899 Other long term (current) drug therapy: Secondary | ICD-10-CM | POA: Diagnosis not present

## 2018-04-18 DIAGNOSIS — G9009 Other idiopathic peripheral autonomic neuropathy: Secondary | ICD-10-CM | POA: Diagnosis not present

## 2018-04-19 DIAGNOSIS — Q2546 Tortuous aortic arch: Secondary | ICD-10-CM | POA: Diagnosis not present

## 2018-04-19 DIAGNOSIS — J9811 Atelectasis: Secondary | ICD-10-CM | POA: Diagnosis not present

## 2018-04-20 ENCOUNTER — Emergency Department (HOSPITAL_COMMUNITY): Payer: Medicare HMO

## 2018-04-20 ENCOUNTER — Observation Stay (HOSPITAL_COMMUNITY)
Admission: EM | Admit: 2018-04-20 | Discharge: 2018-04-21 | Disposition: A | Discharge status: Skilled Nursing Facility | Payer: Medicare HMO | Attending: Internal Medicine | Admitting: Internal Medicine

## 2018-04-20 ENCOUNTER — Encounter (HOSPITAL_COMMUNITY): Payer: Self-pay | Admitting: Emergency Medicine

## 2018-04-20 ENCOUNTER — Other Ambulatory Visit: Payer: Self-pay

## 2018-04-20 DIAGNOSIS — D72829 Elevated white blood cell count, unspecified: Secondary | ICD-10-CM | POA: Insufficient documentation

## 2018-04-20 DIAGNOSIS — R531 Weakness: Secondary | ICD-10-CM | POA: Diagnosis not present

## 2018-04-20 DIAGNOSIS — G4700 Insomnia, unspecified: Secondary | ICD-10-CM | POA: Diagnosis not present

## 2018-04-20 DIAGNOSIS — R400 Somnolence: Secondary | ICD-10-CM

## 2018-04-20 DIAGNOSIS — R410 Disorientation, unspecified: Secondary | ICD-10-CM

## 2018-04-20 DIAGNOSIS — M5136 Other intervertebral disc degeneration, lumbar region: Secondary | ICD-10-CM | POA: Insufficient documentation

## 2018-04-20 DIAGNOSIS — Z955 Presence of coronary angioplasty implant and graft: Secondary | ICD-10-CM | POA: Diagnosis not present

## 2018-04-20 DIAGNOSIS — G8929 Other chronic pain: Secondary | ICD-10-CM | POA: Insufficient documentation

## 2018-04-20 DIAGNOSIS — N281 Cyst of kidney, acquired: Secondary | ICD-10-CM | POA: Insufficient documentation

## 2018-04-20 DIAGNOSIS — D696 Thrombocytopenia, unspecified: Secondary | ICD-10-CM | POA: Diagnosis not present

## 2018-04-20 DIAGNOSIS — R945 Abnormal results of liver function studies: Secondary | ICD-10-CM | POA: Diagnosis not present

## 2018-04-20 DIAGNOSIS — R4020 Unspecified coma: Secondary | ICD-10-CM | POA: Diagnosis not present

## 2018-04-20 DIAGNOSIS — N400 Enlarged prostate without lower urinary tract symptoms: Secondary | ICD-10-CM | POA: Insufficient documentation

## 2018-04-20 DIAGNOSIS — Z880 Allergy status to penicillin: Secondary | ICD-10-CM | POA: Insufficient documentation

## 2018-04-20 DIAGNOSIS — N183 Chronic kidney disease, stage 3 (moderate): Secondary | ICD-10-CM | POA: Insufficient documentation

## 2018-04-20 DIAGNOSIS — Z8249 Family history of ischemic heart disease and other diseases of the circulatory system: Secondary | ICD-10-CM | POA: Insufficient documentation

## 2018-04-20 DIAGNOSIS — R17 Unspecified jaundice: Secondary | ICD-10-CM

## 2018-04-20 DIAGNOSIS — M6282 Rhabdomyolysis: Secondary | ICD-10-CM | POA: Diagnosis not present

## 2018-04-20 DIAGNOSIS — I8393 Asymptomatic varicose veins of bilateral lower extremities: Secondary | ICD-10-CM | POA: Insufficient documentation

## 2018-04-20 DIAGNOSIS — D631 Anemia in chronic kidney disease: Secondary | ICD-10-CM | POA: Diagnosis not present

## 2018-04-20 DIAGNOSIS — J150 Pneumonia due to Klebsiella pneumoniae: Secondary | ICD-10-CM | POA: Diagnosis not present

## 2018-04-20 DIAGNOSIS — R52 Pain, unspecified: Secondary | ICD-10-CM

## 2018-04-20 DIAGNOSIS — Z8673 Personal history of transient ischemic attack (TIA), and cerebral infarction without residual deficits: Secondary | ICD-10-CM | POA: Insufficient documentation

## 2018-04-20 DIAGNOSIS — D320 Benign neoplasm of cerebral meninges: Secondary | ICD-10-CM | POA: Insufficient documentation

## 2018-04-20 DIAGNOSIS — F329 Major depressive disorder, single episode, unspecified: Secondary | ICD-10-CM | POA: Diagnosis not present

## 2018-04-20 DIAGNOSIS — I700 Atherosclerosis of aorta: Secondary | ICD-10-CM | POA: Insufficient documentation

## 2018-04-20 DIAGNOSIS — E876 Hypokalemia: Secondary | ICD-10-CM | POA: Diagnosis not present

## 2018-04-20 DIAGNOSIS — K5730 Diverticulosis of large intestine without perforation or abscess without bleeding: Secondary | ICD-10-CM | POA: Insufficient documentation

## 2018-04-20 DIAGNOSIS — R4182 Altered mental status, unspecified: Secondary | ICD-10-CM | POA: Diagnosis not present

## 2018-04-20 DIAGNOSIS — G20 Parkinson's disease: Secondary | ICD-10-CM | POA: Diagnosis present

## 2018-04-20 DIAGNOSIS — I712 Thoracic aortic aneurysm, without rupture: Secondary | ICD-10-CM | POA: Diagnosis not present

## 2018-04-20 DIAGNOSIS — J189 Pneumonia, unspecified organism: Secondary | ICD-10-CM | POA: Diagnosis not present

## 2018-04-20 DIAGNOSIS — I672 Cerebral atherosclerosis: Secondary | ICD-10-CM | POA: Insufficient documentation

## 2018-04-20 DIAGNOSIS — K8020 Calculus of gallbladder without cholecystitis without obstruction: Secondary | ICD-10-CM | POA: Insufficient documentation

## 2018-04-20 DIAGNOSIS — K838 Other specified diseases of biliary tract: Secondary | ICD-10-CM

## 2018-04-20 DIAGNOSIS — Z91010 Allergy to peanuts: Secondary | ICD-10-CM | POA: Insufficient documentation

## 2018-04-20 DIAGNOSIS — Z91018 Allergy to other foods: Secondary | ICD-10-CM | POA: Insufficient documentation

## 2018-04-20 DIAGNOSIS — Z9049 Acquired absence of other specified parts of digestive tract: Secondary | ICD-10-CM | POA: Insufficient documentation

## 2018-04-20 DIAGNOSIS — I129 Hypertensive chronic kidney disease with stage 1 through stage 4 chronic kidney disease, or unspecified chronic kidney disease: Secondary | ICD-10-CM | POA: Diagnosis not present

## 2018-04-20 DIAGNOSIS — Z809 Family history of malignant neoplasm, unspecified: Secondary | ICD-10-CM | POA: Insufficient documentation

## 2018-04-20 DIAGNOSIS — I959 Hypotension, unspecified: Secondary | ICD-10-CM | POA: Diagnosis not present

## 2018-04-20 DIAGNOSIS — E860 Dehydration: Secondary | ICD-10-CM | POA: Insufficient documentation

## 2018-04-20 DIAGNOSIS — G629 Polyneuropathy, unspecified: Secondary | ICD-10-CM | POA: Insufficient documentation

## 2018-04-20 DIAGNOSIS — I2510 Atherosclerotic heart disease of native coronary artery without angina pectoris: Secondary | ICD-10-CM | POA: Insufficient documentation

## 2018-04-20 DIAGNOSIS — R0689 Other abnormalities of breathing: Secondary | ICD-10-CM | POA: Diagnosis not present

## 2018-04-20 DIAGNOSIS — N179 Acute kidney failure, unspecified: Secondary | ICD-10-CM | POA: Diagnosis not present

## 2018-04-20 DIAGNOSIS — G20A1 Parkinson's disease without dyskinesia, without mention of fluctuations: Secondary | ICD-10-CM | POA: Diagnosis present

## 2018-04-20 DIAGNOSIS — R0902 Hypoxemia: Secondary | ICD-10-CM | POA: Diagnosis not present

## 2018-04-20 DIAGNOSIS — Z79899 Other long term (current) drug therapy: Secondary | ICD-10-CM | POA: Insufficient documentation

## 2018-04-20 LAB — CBC WITH DIFFERENTIAL/PLATELET
Abs Immature Granulocytes: 0.02 10*3/uL (ref 0.00–0.07)
Basophils Absolute: 0 10*3/uL (ref 0.0–0.1)
Basophils Relative: 0 %
EOS PCT: 2 %
Eosinophils Absolute: 0.2 10*3/uL (ref 0.0–0.5)
HEMATOCRIT: 40.6 % (ref 39.0–52.0)
HEMOGLOBIN: 13.2 g/dL (ref 13.0–17.0)
Immature Granulocytes: 0 %
LYMPHS ABS: 1 10*3/uL (ref 0.7–4.0)
Lymphocytes Relative: 11 %
MCH: 28.8 pg (ref 26.0–34.0)
MCHC: 32.5 g/dL (ref 30.0–36.0)
MCV: 88.5 fL (ref 80.0–100.0)
Monocytes Absolute: 0.4 10*3/uL (ref 0.1–1.0)
Monocytes Relative: 5 %
NRBC: 0 % (ref 0.0–0.2)
Neutro Abs: 7.1 10*3/uL (ref 1.7–7.7)
Neutrophils Relative %: 82 %
Platelets: 174 10*3/uL (ref 150–400)
RBC: 4.59 MIL/uL (ref 4.22–5.81)
RDW: 13.2 % (ref 11.5–15.5)
WBC: 8.7 10*3/uL (ref 4.0–10.5)

## 2018-04-20 LAB — AMMONIA: Ammonia: 10 umol/L (ref 9–35)

## 2018-04-20 LAB — URINALYSIS, ROUTINE W REFLEX MICROSCOPIC
Bilirubin Urine: NEGATIVE
GLUCOSE, UA: NEGATIVE mg/dL
Hgb urine dipstick: NEGATIVE
Ketones, ur: NEGATIVE mg/dL
LEUKOCYTES UA: NEGATIVE
NITRITE: NEGATIVE
Protein, ur: NEGATIVE mg/dL
Specific Gravity, Urine: 1.01 (ref 1.005–1.030)
pH: 7 (ref 5.0–8.0)

## 2018-04-20 LAB — COMPREHENSIVE METABOLIC PANEL
ALK PHOS: 95 U/L (ref 38–126)
ALT: 8 U/L (ref 0–44)
ANION GAP: 14 (ref 5–15)
AST: 29 U/L (ref 15–41)
Albumin: 4.4 g/dL (ref 3.5–5.0)
BUN: 36 mg/dL — ABNORMAL HIGH (ref 8–23)
CALCIUM: 9.4 mg/dL (ref 8.9–10.3)
CO2: 34 mmol/L — ABNORMAL HIGH (ref 22–32)
CREATININE: 1.44 mg/dL — AB (ref 0.61–1.24)
Chloride: 90 mmol/L — ABNORMAL LOW (ref 98–111)
GFR calc non Af Amer: 42 mL/min — ABNORMAL LOW (ref >60)
GFR, EST AFRICAN AMERICAN: 48 mL/min — AB (ref >60)
GLUCOSE: 100 mg/dL — AB (ref 70–99)
Potassium: 3.3 mmol/L — ABNORMAL LOW (ref 3.5–5.1)
Sodium: 138 mmol/L (ref 135–145)
Total Bilirubin: 2.4 mg/dL — ABNORMAL HIGH (ref 0.3–1.2)
Total Protein: 7.8 g/dL (ref 6.5–8.1)

## 2018-04-20 LAB — LACTIC ACID, PLASMA: LACTIC ACID, VENOUS: 1 mmol/L (ref 0.5–1.9)

## 2018-04-20 LAB — TROPONIN I: Troponin I: <0.03 ng/mL (ref <0.03)

## 2018-04-20 MED ORDER — SODIUM CHLORIDE 0.9 % IV BOLUS
500.0000 mL | Freq: Once | INTRAVENOUS | Status: AC
  Administered 2018-04-20: 500 mL via INTRAVENOUS

## 2018-04-20 MED ORDER — POTASSIUM CHLORIDE CRYS ER 20 MEQ PO TBCR
40.0000 meq | EXTENDED_RELEASE_TABLET | Freq: Once | ORAL | Status: DC
  Filled 2018-04-20: qty 2

## 2018-04-20 MED ORDER — SODIUM CHLORIDE 0.9 % IV SOLN
INTRAVENOUS | Status: DC
  Administered 2018-04-20 (×2): via INTRAVENOUS

## 2018-04-20 NOTE — ED Triage Notes (Signed)
Pt from [facility], called out for decreased level of consciousness worsening over the past few days. Pt usually gets up into wheelchair, goes to daily activities, but has been unable to do so do . Sinus brady on monitor. Pt recently just finished tx for pneumonia. Pt received IV fluids at facility with no change. Hx of Parkinson's.

## 2018-04-20 NOTE — ED Notes (Signed)
Pt returned from xray

## 2018-04-20 NOTE — ED Notes (Signed)
Pt is not able to sit or stand for orthostatic vitals.

## 2018-04-20 NOTE — ED Provider Notes (Signed)
[facility] EMERGENCY DEPARTMENT Provider Note   CSN: [id number] Arrival date & time: 04/20/18  1032     History   Chief Complaint Chief Complaint  Patient presents with  . Altered Mental Status    HPI Julian Ramirez is a 84 y.o. male.  The history is provided by the patient, the nursing home and the EMS personnel. The history is limited by the condition of the patient (AMS).  Altered Mental Status     Pt was seen at 1215. Per EMS and NH report: Caregiver at [facility] states pt has had change in mental status and increasing generalized weakness, worsening over the past few days. Facility states pt usually gets up into wheelchair and goes to daily activities, but has been unable to do so recently. Facility gave IVF without change. Pt recently completed tx for pneumonia. Pt himself does not know why he was sent to the ED.    Past Medical History:  Diagnosis Date  . Chronic pain   . Edema   . Hypertension   . Neuropathy   . Parkinson disease [facility])     Patient Active Problem List   Diagnosis Date Noted  . Varicose veins of bilateral lower extremities with other complications 74/01/1447  . Rhabdomyolysis 06/29/2013  . Fall 06/29/2013  . Generalized weakness 06/29/2013  . Acute renal failure ([place]) 06/29/2013  . Leukocytosis 06/29/2013  . Anemia 06/29/2013  . Thrombocytopenia ([place]) 06/29/2013  . Weakness 06/29/2013  . Hypertension   . Parkinson disease ([place])   . Neuropathy   . Edema     Past Surgical History:  Procedure Laterality Date  . BACK SURGERY    . BRAIN SURGERY    . CORONARY ANGIOPLASTY WITH STENT PLACEMENT          Home Medications    Prior to Admission medications   Medication Sig Start Date End Date Taking? Authorizing Provider  Amino Acids-Protein Hydrolys (FEEDING SUPPLEMENT, PRO-STAT SUGAR FREE 64,) LIQD Take 30 mLs by mouth daily.    [provider]  Ascorbic Acid (VITAMIN C) 100 MG tablet Take 100 mg by mouth daily.     [provider]  Baclofen 5 MG TABS Take 5 mg by mouth 2 (two) times daily. 01/31/18   [provider]  carbidopa-levodopa (SINEMET IR) 25-100 MG tablet Take 2 tablets by mouth 3 (three) times daily. 7 am. 11 am. 4 pm. 02/17/18   Tat, Eustace Quail, DO  DULoxetine (CYMBALTA) 30 MG capsule Take 30 mg by mouth daily. 01/30/18   [provider]  finasteride (PROSCAR) 5 MG tablet Take 5 mg by mouth daily.    [provider]  furosemide (LASIX) 20 MG tablet Take by mouth. 3 in the morning, 2 in the evening    [provider]  Menthol (ICY HOT) 5 % PTCH Apply topically.    [provider]  Oxycodone HCl 10 MG TABS Take 10 mg by mouth 2 (two) times daily. 02/01/18   [provider]  polyethylene glycol (MIRALAX / GLYCOLAX) packet Take 17 g by mouth daily.    [provider]  potassium chloride SA (K-DUR,KLOR-CON) 20 MEQ tablet Take 20 mEq by mouth daily.    [provider]  pregabalin (LYRICA) 100 MG capsule Take 100 mg by mouth 3 (three) times daily. 02/07/18   [provider]  tamsulosin (FLOMAX) 0.4 MG CAPS capsule Take 0.4 mg by mouth at bedtime.    [provider]  traZODone (DESYREL) 50  MG tablet Take 50 mg by mouth at bedtime.    [provider]    Family History Family History  Problem Relation Age of Onset  . Heart attack Mother   . Heart attack Father   . Cancer Sister     Social History Social History   Tobacco Use  . Smoking status: Never Smoker  . Smokeless tobacco: Never Used  Substance Use Topics  . Alcohol use: No  . Drug use: No     Allergies   Penicillins; Peanuts [peanut oil]; and Wheat bran   Review of Systems Review of Systems  Unable to perform ROS: Mental status change     Physical Exam Updated Vital Signs BP (!) 103/55   Pulse (!) 53   Temp 98.2 F (36.8 C) (Oral)   Resp 15   Ht 6\' 1"  (1.854 m)   Wt 83.9 kg   SpO2 93%   BMI 24.41 kg/m     Patient Vitals for the past 24 hrs:  BP Temp Temp src Pulse Resp SpO2 Height Weight  04/20/18 1416 (!) 117/52 - - (!) 52 12 100 % - -  04/20/18 1415 (!) 117/52 - - (!) 50 15 95 % - -  04/20/18 1400 (!) 100/48 - - (!) 51 12 96 % - -  04/20/18 1330 (!) 136/59 - - (!) 55 (!) 21 100 % - -  04/20/18 1230 (!) 103/55 - - (!) 53 15 93 % - -  04/20/18 1200 (!) 104/55 - - (!) 52 12 99 % - -  04/20/18 1130 (!) 106/53 - - (!) 54 16 100 % - -  04/20/18 1100 (!) 95/55 - - (!) 54 17 99 % - -  04/20/18 1044 130/64 98.2 F (36.8 C) Oral (!) 56 19 97 % - -  04/20/18 1039 - - - - - - 6\' 1"  (1.854 m) 83.9 kg    14:17 Orthostatic Vital Signs DF  Orthostatic Lying   BP- Lying: 117/52   Pulse- Lying: 54       Orthostatic Sitting  BP- Sitting: 104/58   Pulse- Sitting: 56       Orthostatic Standing at 0 minutes  BP- Standing at 0 minutes: 114/91Abnormal    Pulse- Standing at 0 minutes: 71      Physical Exam 1220: Physical examination:  Nursing notes reviewed; Vital signs and O2 SAT reviewed;  Constitutional: Well developed, Well nourished, In no acute distress; Head:  Normocephalic, atraumatic; Eyes: EOMI, PERRL, No scleral icterus; ENMT: Mouth and pharynx normal, Mucous membranes dry; Neck: Supple, Full range of motion, No lymphadenopathy; Cardiovascular: Regular rate and rhythm, No gallop; Respiratory: Breath sounds clear & equal bilaterally, No wheezes.  Speaking full sentences with ease, Normal respiratory effort/excursion; Chest: Nontender, Movement normal; Abdomen: Soft, Nontender, Nondistended, Normal bowel sounds; Genitourinary: No CVA tenderness; Spine:  No midline CS, TS, LS tenderness.;; Extremities: Peripheral pulses normal, No tenderness, No edema, No calf edema or asymmetry. Small superficial wound right heel..; Neuro: Awake, alert, confused re: time, events, place. No facial droop. Mask-like face. Speech clear. Grips equal. Strength 3/5 bilat UE's and bilat LE's..; Skin: Color normal,  Warm, Dry.   ED Treatments / Results  Labs (all labs ordered are listed, but only abnormal results are displayed)   EKG EKG Interpretation  Date/Time:  Wednesday April 20 2018 10:40:16 EDT Ventricular Rate:  57 PR Interval:    QRS Duration: 107 QT Interval:  458 QTC Calculation: 446 R Axis:  19 Text Interpretation:  Sinus rhythm Artifact When compared with ECG of 06/29/2013 Artifact is now Present Otherwise no significant change Confirmed by Francine Graven [phone]) on 04/20/2018 12:43:00 PM   Radiology   Procedures Procedures (including critical care time)  Medications Ordered in ED Medications  0.9 %  sodium chloride infusion (has no administration in time range)     Initial Impression / Assessment and Plan / ED Course  I have reviewed the triage vital signs and the nursing notes.  Pertinent labs & imaging results that were available during my care of the patient were reviewed by me and considered in my medical decision making (see chart for details).  MDM Reviewed: previous chart, nursing note and vitals Reviewed previous: labs and ECG Interpretation: labs, ECG, x-ray, CT scan, MRI and ultrasound    Results for orders placed or performed during the hospital encounter of 04/20/18  CBC with Differential/Platelet  Result Value Ref Range   WBC 8.7 4.0 - 10.5 K/uL   RBC 4.59 4.22 - 5.81 MIL/uL   Hemoglobin 13.2 13.0 - 17.0 g/dL   HCT 40.6 39.0 - 52.0 %   MCV 88.5 80.0 - 100.0 fL   MCH 28.8 26.0 - 34.0 pg   MCHC 32.5 30.0 - 36.0 g/dL   RDW 13.2 11.5 - 15.5 %   Platelets 174 150 - 400 K/uL   nRBC 0.0 0.0 - 0.2 %   Neutrophils Relative % 82 %   Neutro Abs 7.1 1.7 - 7.7 K/uL   Lymphocytes Relative 11 %   Lymphs Abs 1.0 0.7 - 4.0 K/uL   Monocytes Relative 5 %   Monocytes Absolute 0.4 0.1 - 1.0 K/uL   Eosinophils Relative 2 %   Eosinophils Absolute 0.2 0.0 - 0.5 K/uL   Basophils Relative 0 %   Basophils Absolute 0.0 0.0 - 0.1 K/uL   Immature Granulocytes  0 %   Abs Immature Granulocytes 0.02 0.00 - 0.07 K/uL  Comprehensive metabolic panel  Result Value Ref Range   Sodium 138 135 - 145 mmol/L   Potassium 3.3 (L) 3.5 - 5.1 mmol/L   Chloride 90 (L) 98 - 111 mmol/L   CO2 34 (H) 22 - 32 mmol/L   Glucose, Bld 100 (H) 70 - 99 mg/dL   BUN 36 (H) 8 - 23 mg/dL   Creatinine, Ser 1.44 (H) 0.61 - 1.24 mg/dL   Calcium 9.4 8.9 - 10.3 mg/dL   Total Protein 7.8 6.5 - 8.1 g/dL   Albumin 4.4 3.5 - 5.0 g/dL   AST 29 15 - 41 U/L   ALT 8 0 - 44 U/L   Alkaline Phosphatase 95 38 - 126 U/L   Total Bilirubin 2.4 (H) 0.3 - 1.2 mg/dL   GFR calc non Af Amer 42 (L) >60 mL/min   GFR calc Af Amer 48 (L) >60 mL/min   Anion gap 14 5 - 15  Urinalysis, Routine w reflex microscopic  Result Value Ref Range   Color, Urine YELLOW YELLOW   APPearance CLEAR CLEAR   Specific Gravity, Urine 1.010 1.005 - 1.030   pH 7.0 5.0 - 8.0   Glucose, UA NEGATIVE NEGATIVE mg/dL   Hgb urine dipstick NEGATIVE NEGATIVE   Bilirubin Urine NEGATIVE NEGATIVE   Ketones, ur NEGATIVE NEGATIVE mg/dL   Protein, ur NEGATIVE NEGATIVE mg/dL   Nitrite NEGATIVE NEGATIVE   Leukocytes, UA NEGATIVE NEGATIVE  Troponin I  Result Value Ref Range   Troponin I <0.03 <0.03 ng/mL  Lactic acid, plasma  Result Value Ref Range   Lactic Acid, Venous 1.0 0.5 - 1.9 mmol/L  Ammonia  Result Value Ref Range   Ammonia 10 9 - 35 umol/L   Dg Chest 1 View Result Date: 04/20/2018 CLINICAL DATA:  Weakness, known Parkinson's disease. History of coronary artery disease with stent placement. EXAM: CHEST  1 VIEW COMPARISON:  Chest x-ray of May 25th 2018 FINDINGS: The lungs are adequately inflated and clear. The heart and pulmonary vascularity are normal. The mediastinum is normal in width. There is calcification in the wall of the aortic arch. The observed bony thorax is unremarkable. IMPRESSION: There is no active cardiopulmonary disease. Thoracic aortic atherosclerosis. Electronically Signed   By: David  Martinique M.D.    On: 04/20/2018 13:13   Ct Head Wo Contrast Result Date: 04/20/2018 CLINICAL DATA:  Muscle weakness.  Decreased level of consciousness. EXAM: CT HEAD WITHOUT CONTRAST TECHNIQUE: Contiguous axial images were obtained from the base of the skull through the vertex without intravenous contrast. COMPARISON:  11/13/2016. FINDINGS: Brain: No evidence of acute infarction, hemorrhage, hydrocephalus, extra-axial collection or mass effect. There is an extra-axial hyperdense nodule along the left side of the anterior falx measuring 6 mm, image 24/2. Unchanged from 11/13/2016. there is prominence of the sulci and ventricles compatible with brain atrophy. There is mild diffuse low-attenuation within the subcortical and periventricular white matter compatible with chronic microvascular disease. Vascular: No hyperdense vessel or unexpected calcification. Skull: Normal. Negative for fracture or focal lesion. Old burr hole defect is identified within the right posterior parietal bone. Sinuses/Orbits: Retention cyst versus polyp identified in the left maxillary sinus. No mucosal thickening or air-fluid levels identified within the sinuses. Mastoid air cells are clear. Other: None IMPRESSION: 1. No acute intracranial abnormalities. 2. Chronic small vessel ischemic disease and brain atrophy. 3. Stable subcentimeter left frontal falcine nodule, likely meningioma. Electronically Signed   By: Kerby Moors M.D.   On: 04/20/2018 13:28    US Abdomen Complete Result Date: 04/20/2018 CLINICAL DATA:  Elevated bilirubin.  History of cholecystectomy. EXAM: ABDOMEN ULTRASOUND COMPLETE COMPARISON:  MRI abdomen March 06, 2016. FINDINGS: Assessment limited by bowel gas. Gallbladder: Status post cholecystectomy. Common bile duct: Diameter: Dilated at 19 mm, no sonographically identified choledocholithiasis or mass. Liver: Intrahepatic biliary dilatation. No focal lesion identified. Within normal limits in parenchymal echogenicity. Portal  vein is patent on color Doppler imaging with normal direction of blood flow towards the liver. IVC: No abnormality visualized. Pancreas: Visualized portion unremarkable. Spleen: Mild suspected splenomegaly, approximately 14 cm. Assessment limited due to bowel gas. Right Kidney: Length: 10.7 cm. Cortical thinning. Echogenicity within normal limits. No mass or hydronephrosis visualized. Left Kidney: Not sonographically identified, likely obscured by bowel gas. Abdominal aorta: Predominantly obscured by bowel gas, approximately 2 cm proximally. Other findings: None. IMPRESSION: 1. Limited assessment due to bowel gas, LEFT kidney not sonographically identified. 2. Intra and extrahepatic biliary dilatation, greater than expected for postsurgical change. Consider MRCP to evaluate for obstructing stone or mass. 3. Suspected splenomegaly. Electronically Signed   By: Elon Alas M.D.   On: 04/20/2018 16:30    Mr Jodene Nam Head Wo Contrast Result Date: 04/20/2018 CLINICAL DATA:  Altered mental status for a few days. History of Parkinson's disease, brain surgery and hypertension. EXAM: MRI HEAD WITHOUT CONTRAST MRA HEAD WITHOUT CONTRAST TECHNIQUE: Multiplanar, multiecho pulse sequences of the brain and surrounding structures were obtained without intravenous contrast. Angiographic images of the head were obtained using MRA technique without contrast. COMPARISON:  CT HEAD April 20, 2018 and MRI head June 29, 2013 FINDINGS: MRI HEAD FINDINGS-multiple sequences are moderately motion degraded. INTRACRANIAL CONTENTS: No reduced diffusion to suggest acute ischemia. No susceptibility artifact to suggest hemorrhage. The ventricles and sulci are normal for patient's age. Old small LEFT cerebellar infarct. No suspicious parenchymal signal, masses, mass effect. No abnormal extra-axial fluid collections. 13 x 6 mm LEFT parafalcine meningioma, new from prior MRI. VASCULAR: Normal major intracranial vascular flow voids present at  skull base. SKULL AND UPPER CERVICAL SPINE: No abnormal sellar expansion. Biparietal bur holes better seen on prior CT. No suspicious calvarial bone marrow signal. Craniocervical junction maintained. SINUSES/ORBITS: LEFT maxillary mucosal retention cyst. Mastoid air cells are well aerated. The included ocular globes and orbital contents are non-suspicious. Status post bilateral ocular lens implants. OTHER: None. MRA HEAD FINDINGS ANTERIOR CIRCULATION: Normal flow related enhancement of the included cervical, petrous, cavernous and supraclinoid internal carotid arteries. Patent anterior communicating artery. Patent anterior and middle cerebral arteries, mild luminal irregularity compatible with atherosclerosis. No large vessel occlusion, flow limiting stenosis, aneurysm. POSTERIOR CIRCULATION: LEFT vertebral artery is dominant. Vertebrobasilar arteries are patent, with normal flow related enhancement of the main branch vessels. Patent posterior cerebral arteries, mild luminal irregularity compatible with atherosclerosis. No large vessel occlusion, flow limiting stenosis,  aneurysm. ANATOMIC VARIANTS: Hypoplastic LEFT A1 segment. Source images and MIP images were reviewed. IMPRESSION: MRI HEAD: 1. No acute intracranial process. 2. 13 x 6 mm LEFT parafalcine meningioma without mass effect. 3. Old small LEFT cerebellar infarct, otherwise negative noncontrast MRI head for age. MRA HEAD: 1. Normal burden large vessel occlusion or flow-limiting stenosis. 2. Mild intracranial atherosclerosis. Electronically Signed   By: Elon Alas M.D.   On: 04/20/2018 18:07    Mr Brain Wo Contrast (neuro Protocol) Result Date: 04/20/2018 CLINICAL DATA:  Altered mental status for a few days. History of Parkinson's disease, brain surgery and hypertension. EXAM: MRI HEAD WITHOUT CONTRAST MRA HEAD WITHOUT CONTRAST TECHNIQUE: Multiplanar, multiecho pulse sequences of the brain and surrounding structures were obtained without  intravenous contrast. Angiographic images of the head were obtained using MRA technique without contrast. COMPARISON:  CT HEAD April 20, 2018 and MRI head June 29, 2013 FINDINGS: MRI HEAD FINDINGS-multiple sequences are moderately motion degraded. INTRACRANIAL CONTENTS: No reduced diffusion to suggest acute ischemia. No susceptibility artifact to suggest hemorrhage. The ventricles and sulci are normal for patient's age. Old small LEFT cerebellar infarct. No suspicious parenchymal signal, masses, mass effect. No abnormal extra-axial fluid collections. 13 x 6 mm LEFT parafalcine meningioma, new from prior MRI. VASCULAR: Normal major intracranial vascular flow voids present at skull base. SKULL AND UPPER CERVICAL SPINE: No abnormal sellar expansion. Biparietal bur holes better seen on prior CT. No suspicious calvarial bone marrow signal. Craniocervical junction maintained. SINUSES/ORBITS: LEFT maxillary mucosal retention cyst. Mastoid air cells are well aerated. The included ocular globes and orbital contents are non-suspicious. Status post bilateral ocular lens implants. OTHER: None. MRA HEAD FINDINGS ANTERIOR CIRCULATION: Normal flow related enhancement of the included cervical, petrous, cavernous and supraclinoid internal carotid arteries. Patent anterior communicating artery. Patent anterior and middle cerebral arteries, mild luminal irregularity compatible with atherosclerosis. No large vessel occlusion, flow limiting stenosis, aneurysm. POSTERIOR CIRCULATION: LEFT vertebral artery is dominant. Vertebrobasilar arteries are patent, with normal flow related enhancement of the main branch vessels. Patent posterior cerebral arteries, mild luminal irregularity compatible with atherosclerosis. No large vessel occlusion, flow limiting stenosis,  aneurysm. ANATOMIC VARIANTS: Hypoplastic LEFT A1 segment. Source images and MIP images were reviewed. IMPRESSION: MRI  HEAD: 1. No acute intracranial process. 2. 13 x 6 mm  LEFT parafalcine meningioma without mass effect. 3. Old small LEFT cerebellar infarct, otherwise negative noncontrast MRI head for age. MRA HEAD: 1. Normal burden large vessel occlusion or flow-limiting stenosis. 2. Mild intracranial atherosclerosis. Electronically Signed   By: Elon Alas M.D.   On: 04/20/2018 18:07    Results for JKWON, TREPTOW (MRN [id number]) as of 04/20/2018 14:47  Ref. Range 08/18/2013 12:47 04/09/2018 16:54 04/20/2018 10:41  BUN Latest Ref Range: 8 - 23 mg/dL 18 37 (H) 36 (H)  Creatinine Latest Ref Range: 0.61 - 1.24 mg/dL 1.24 2.43 (H) 1.44 (H)   Results for EDWYN, INCLAN (MRN [id number]) as of 04/20/2018 20:23  Ref. Range 08/18/2013 12:47 04/09/2018 16:54 04/20/2018 10:41  AST Latest Ref Range: 15 - 41 U/L 9 24 29   ALT Latest Ref Range: 0 - 44 U/L 8 <5 8  Total Bilirubin Latest Ref Range: 0.3 - 1.2 mg/dL 0.7 1.2 2.4 (H)    1445:  Pt initially unable to sit or stand; judicious IVF given. Pt now able to sit/stand with assist x2. IVF continues. Will replete potassium PO. BUN/Cr near pt's baseline (last labs date back to 2015).  Tbili newly elevated. T/C returned from [facility] Dr. Dyann Kief, case discussed, including:  HPI, pertinent PM/SHx, VS/PE, dx testing, ED course and treatment:  Agrees with ED workup, states no clear indication for admission at this time, obtain MRI brain to complete full workup, but otherwise unless they show significant abnormality there is no indication for admission and pt can be d/c back to the NH with outpt f/u. Will order MRI brain as well as [place] abd and ammonia level.   19:  Pt's family now at bedside: states pt has had a "slow decline" over the past 10 days, worse over the past 2 days; concerned regarding discharge back to facility. Dx and testing, as well as d/w [facility] MD, d/w pt's family.  Questions answered.  Verb understanding. Agreeable with plan.  2000:  IVF continues. [place] with dilated hepatic ducts and Tbili elevated. MRI with old  stroke. Pt continues intermittently lethargic, but easily arousable however, and confused. Neuro exam non-focal, resps easy, abd soft/NT.  T/C returned from [facility] Dr. Denton Brick, case discussed, including:  HPI, pertinent PM/SHx, VS/PE, dx testing, ED course and treatment:  requests to call GI MD to see if pt would need admit/OK to stay at [facility] for MRCP for US findings and Tbili elevation.  2015:  T/C returned from GI Dr. Laural Golden, case discussed, including:  HPI, pertinent PM/SHx, VS/PE, dx testing, ED course and treatment:  States CBD dilatation size on [place] is significant and needs f/u testing tomorrow (ie: MRCP), admit pt to [facility] service, continue IVF, recheck LFT's tomorrow and GI service will see pt tomorrow in consult. T/C to [facility] Dr. Denton Brick re: conversation with GI MD, she will come to the ED for evaluation for admission.        Final Clinical Impressions(s) / ED Diagnoses   Final diagnoses:  None    ED Discharge Orders    None       Francine Graven, DO 04/21/18 1329

## 2018-04-20 NOTE — ED Notes (Signed)
Pt did orthostatic vital signs only complaint was pain in his left flank. MD notified.

## 2018-04-20 NOTE — ED Notes (Signed)
Pt requesting dentures. RN called [name] to inquire why dentures were not sent with Pt to hospital with EMS, and the RN at [facility] stated she would contact their Supervisor and call this RN back with any new updates on the situation. Pt requesting food and provided with pudding until situation resolved.

## 2018-04-20 NOTE — ED Notes (Signed)
Pt alert after in and out catheter. Pt asked where he was and why was he here. I informed patient that [facility] sent him here due to infection suspicion. Patient very anxious but is now calming down after I oriented him.

## 2018-04-20 NOTE — ED Notes (Signed)
Patient pulling off EKG leads off, replaced them. Oral mouth care given. Swab and oral gel mositurizer.

## 2018-04-20 NOTE — ED Notes (Signed)
Patient transported to MRI 

## 2018-04-20 NOTE — H&P (Signed)
History and Physical    [name]:[id number] DOB: August 29, 1926 DOA: 04/20/2018  PCP: Caryl Bis, MD   Patient coming from: [place] [facility]  I have personally briefly reviewed patient's old medical records in [place]  Chief Complaint: Generalized weakness  HPI: [facility] Ramirez is a 84 y.o. male with medical history significant for HTN, parkinsons, was brought to the ED with complaints of generalized weakness worse especially over the past 2 days but family notes a steady decline over the past at least 1 week.  Patient to me denies chest pain.  No nausea no vomiting no abdominal pain.  Denies dysuria.  Patient has been too weak to get into his wheelchair, as he normally does.  He just finished a course of antibiotics for pneumonia.   ED Course: Soft to stable blood pressure. Heart rates 50s.  Unremarkable CBC.  K mildly low 3.8.  Creatinine 1.4, improving from last creatinine on file 2.4.  Bilirubin elevated at 2.4.  Patient was given 1 L bolus normal saline.  59meq KCl.  Portable chest x-ray CT head negative for acute abnormality.  Renal ultrasound-intra-and extrahepatic biliary dilatation greater than expected for postsurgical change, MRCP recommended to evaluate for obstructing stone or mass.  MRI MRA brain negative for acute abnormality, parafalcine meningioma without mass-effect. EDP talked to gastroenterologist Dr. Rehman-recommended hospitalist admission, for CBD evaluation by MRCP tomorrow.,  Trend enzymes, and hydrate.  Review of Systems: As per HPI all other systems reviewed and negative.  Past Medical History:  Diagnosis Date  . Chronic pain   . Edema   . Hypertension   . Neuropathy   . Parkinson disease [facility])     Past Surgical History:  Procedure Laterality Date  . BACK SURGERY    . BRAIN SURGERY    . CORONARY ANGIOPLASTY WITH STENT PLACEMENT       reports that he has never smoked. He has never used smokeless tobacco. He reports that he does not drink  alcohol or use drugs.  Allergies  Allergen Reactions  . Penicillins Shortness Of Breath and Swelling    Mouth swells   . Peanuts [Peanut Oil] Swelling    Causes swelling in the lower body areas.   . Wheat Bran Swelling    Wheat four causes swelling in the lower body areas     Family History  Problem Relation Age of Onset  . Heart attack Mother   . Heart attack Father   . Cancer Sister     Prior to Admission medications   Medication Sig Start Date End Date Taking? Authorizing Provider  Amino Acids-Protein Hydrolys (FEEDING SUPPLEMENT, PRO-STAT SUGAR FREE 64,) LIQD Take 30 mLs by mouth daily.    [provider]  Ascorbic Acid (VITAMIN C) 100 MG tablet Take 100 mg by mouth daily.    [provider]  Baclofen 5 MG TABS Take 5 mg by mouth 2 (two) times daily. 01/31/18   [provider]  carbidopa-levodopa (SINEMET IR) 25-100 MG tablet Take 2 tablets by mouth 3 (three) times daily. 7 am. 11 am. 4 pm. 02/17/18   Tat, Eustace Quail, DO  DULoxetine (CYMBALTA) 30 MG capsule Take 30 mg by mouth daily. 01/30/18   [provider]  finasteride (PROSCAR) 5 MG tablet Take 5 mg by mouth daily.    [provider]  furosemide (LASIX) 20 MG tablet Take by mouth. 3 in the morning, 2 in the evening    [provider]  Menthol (  ICY HOT) 5 % PTCH Apply topically.    [provider]  Oxycodone HCl 10 MG TABS Take 10 mg by mouth 2 (two) times daily. 02/01/18   [provider]  polyethylene glycol (MIRALAX / GLYCOLAX) packet Take 17 g by mouth daily.    [provider]  potassium chloride SA (K-DUR,KLOR-CON) 20 MEQ tablet Take 20 mEq by mouth daily.    [provider]  pregabalin (LYRICA) 100 MG capsule Take 100 mg by mouth 3 (three) times daily. 02/07/18   [provider]  tamsulosin (FLOMAX) 0.4 MG CAPS capsule Take 0.4 mg by mouth at bedtime.    [provider]  traZODone (DESYREL) 50 MG tablet Take 50 mg by  mouth at bedtime.    [provider]    Physical Exam: Vitals:   04/20/18 1830 04/20/18 1930 04/20/18 2000 04/20/18 2030  BP: (!) 139/46 (!) 145/70 (!) 144/57 (!) 104/54  Pulse: (!) 56 60    Resp: 16 11 18    Temp:      TempSrc:      SpO2: 95% 98%    Weight:      Height:        Constitutional: , calm, comfortable, hard of hearing Vitals:   04/20/18 1830 04/20/18 1930 04/20/18 2000 04/20/18 2030  BP: (!) 139/46 (!) 145/70 (!) 144/57 (!) 104/54  Pulse: (!) 56 60    Resp: 16 11 18    Temp:      TempSrc:      SpO2: 95% 98%    Weight:      Height:       Eyes: PERRL, lids and conjunctivae normal ENMT: Mucous membranes are moist. Posterior pharynx clear of any exudate or lesions.Edentulous Neck: normal, supple, no masses, no thyromegaly Respiratory: clear to auscultation bilaterally, no wheezing, no crackles. Normal respiratory effort. No accessory muscle use.  Cardiovascular: Regular rate and rhythm, no murmurs / rubs / gallops. No extremity edema. 2+ pedal pulses.   Abdomen: no tenderness, no masses palpated. No hepatosplenomegaly. Bowel sounds positive.  Musculoskeletal: no clubbing / cyanosis. No joint deformity upper and lower extremities. Good ROM, no contractures. Normal muscle tone.  Skin: no rashes, lesions, ulcers. No induration Neurologic: CN 2-12 grossly intact.  Strength 5/5 in all 4.  Psychiatric: Normal judgment and insight.  Lethargic but alert and oriented x 3. Normal mood.   Labs on Admission: I have personally reviewed following labs and imaging studies  CBC: Recent Labs  Lab 04/20/18 1041  WBC 8.7  NEUTROABS 7.1  HGB 13.2  HCT 40.6  MCV 88.5  PLT 413   Basic Metabolic Panel: Recent Labs  Lab 04/20/18 1041  NA 138  K 3.3*  CL 90*  CO2 34*  GLUCOSE 100*  BUN 36*  CREATININE 1.44*  CALCIUM 9.4   Liver Function Tests: Recent Labs  Lab 04/20/18 1041  AST 29  ALT 8  ALKPHOS 95  BILITOT 2.4*  PROT 7.8  ALBUMIN 4.4   No results  for input(s): LIPASE, AMYLASE in the last 168 hours. Recent Labs  Lab 04/20/18 1612  AMMONIA 10   Cardiac Enzymes: Recent Labs  Lab 04/20/18 1230  TROPONINI <0.03   Urine analysis:    Component Value Date/Time   COLORURINE YELLOW 04/20/2018 [place] 04/20/2018 1326   LABSPEC 1.010 04/20/2018 1326   PHURINE 7.0 04/20/2018 [place] 04/20/2018 1326   [place] 04/20/2018 [place] 04/20/2018 1326  [place] NEGATIVE 04/20/2018 [place] 04/20/2018 1326   UROBILINOGEN 0.2 08/18/2013 1357   NITRITE NEGATIVE 04/20/2018 1326   LEUKOCYTESUR NEGATIVE 04/20/2018 1326    Radiological Exams on Admission: Dg Chest 1 View  Result Date: 04/20/2018 CLINICAL DATA:  Weakness, known Parkinson's disease. History of coronary artery disease with stent placement. EXAM: CHEST  1 VIEW COMPARISON:  Chest x-ray of May 25th 2018 FINDINGS: The lungs are adequately inflated and clear. The heart and pulmonary vascularity are normal. The mediastinum is normal in width. There is calcification in the wall of the aortic arch. The observed bony thorax is unremarkable. IMPRESSION: There is no active cardiopulmonary disease. Thoracic aortic atherosclerosis. Electronically Signed   By: David  Martinique M.D.   On: 04/20/2018 13:13   Ct Head Wo Contrast  Result Date: 04/20/2018 CLINICAL DATA:  Muscle weakness.  Decreased level of consciousness. EXAM: CT HEAD WITHOUT CONTRAST TECHNIQUE: Contiguous axial images were obtained from the base of the skull through the vertex without intravenous contrast. COMPARISON:  11/13/2016. FINDINGS: Brain: No evidence of acute infarction, hemorrhage, hydrocephalus, extra-axial collection or mass effect. There is an extra-axial hyperdense nodule along the left side of the anterior falx measuring 6 mm, image 24/2. Unchanged from 11/13/2016. there is prominence of the sulci and ventricles compatible with brain atrophy.  There is mild diffuse low-attenuation within the subcortical and periventricular white matter compatible with chronic microvascular disease. Vascular: No hyperdense vessel or unexpected calcification. Skull: Normal. Negative for fracture or focal lesion. Old burr hole defect is identified within the right posterior parietal bone. Sinuses/Orbits: Retention cyst versus polyp identified in the left maxillary sinus. No mucosal thickening or air-fluid levels identified within the sinuses. Mastoid air cells are clear. Other: None IMPRESSION: 1. No acute intracranial abnormalities. 2. Chronic small vessel ischemic disease and brain atrophy. 3. Stable subcentimeter left frontal falcine nodule, likely meningioma. Electronically Signed   By: Kerby Moors M.D.   On: 04/20/2018 13:28   Mr Jodene Nam Head Wo Contrast  Result Date: 04/20/2018 CLINICAL DATA:  Altered mental status for a few days. History of Parkinson's disease, brain surgery and hypertension. EXAM: MRI HEAD WITHOUT CONTRAST MRA HEAD WITHOUT CONTRAST TECHNIQUE: Multiplanar, multiecho pulse sequences of the brain and surrounding structures were obtained without intravenous contrast. Angiographic images of the head were obtained using MRA technique without contrast. COMPARISON:  CT HEAD April 20, 2018 and MRI head June 29, 2013 FINDINGS: MRI HEAD FINDINGS-multiple sequences are moderately motion degraded. INTRACRANIAL CONTENTS: No reduced diffusion to suggest acute ischemia. No susceptibility artifact to suggest hemorrhage. The ventricles and sulci are normal for patient's age. Old small LEFT cerebellar infarct. No suspicious parenchymal signal, masses, mass effect. No abnormal extra-axial fluid collections. 13 x 6 mm LEFT parafalcine meningioma, new from prior MRI. VASCULAR: Normal major intracranial vascular flow voids present at skull base. SKULL AND UPPER CERVICAL SPINE: No abnormal sellar expansion. Biparietal bur holes better seen on prior CT. No suspicious  calvarial bone marrow signal. Craniocervical junction maintained. SINUSES/ORBITS: LEFT maxillary mucosal retention cyst. Mastoid air cells are well aerated. The included ocular globes and orbital contents are non-suspicious. Status post bilateral ocular lens implants. OTHER: None. MRA HEAD FINDINGS ANTERIOR CIRCULATION: Normal flow related enhancement of the included cervical, petrous, cavernous and supraclinoid internal carotid arteries. Patent anterior communicating artery. Patent anterior and middle cerebral arteries, mild luminal irregularity compatible with atherosclerosis. No large vessel occlusion, flow limiting stenosis, aneurysm. POSTERIOR CIRCULATION: LEFT vertebral artery is dominant. Vertebrobasilar arteries are patent, with  normal flow related enhancement of the main branch vessels. Patent posterior cerebral arteries, mild luminal irregularity compatible with atherosclerosis. No large vessel occlusion, flow limiting stenosis,  aneurysm. ANATOMIC VARIANTS: Hypoplastic LEFT A1 segment. Source images and MIP images were reviewed. IMPRESSION: MRI HEAD: 1. No acute intracranial process. 2. 13 x 6 mm LEFT parafalcine meningioma without mass effect. 3. Old small LEFT cerebellar infarct, otherwise negative noncontrast MRI head for age. MRA HEAD: 1. Normal burden large vessel occlusion or flow-limiting stenosis. 2. Mild intracranial atherosclerosis. Electronically Signed   By: Elon Alas M.D.   On: 04/20/2018 18:07   Mr Brain Wo Contrast (neuro Protocol)  Result Date: 04/20/2018 CLINICAL DATA:  Altered mental status for a few days. History of Parkinson's disease, brain surgery and hypertension. EXAM: MRI HEAD WITHOUT CONTRAST MRA HEAD WITHOUT CONTRAST TECHNIQUE: Multiplanar, multiecho pulse sequences of the brain and surrounding structures were obtained without intravenous contrast. Angiographic images of the head were obtained using MRA technique without contrast. COMPARISON:  CT HEAD April 20, 2018 and MRI head June 29, 2013 FINDINGS: MRI HEAD FINDINGS-multiple sequences are moderately motion degraded. INTRACRANIAL CONTENTS: No reduced diffusion to suggest acute ischemia. No susceptibility artifact to suggest hemorrhage. The ventricles and sulci are normal for patient's age. Old small LEFT cerebellar infarct. No suspicious parenchymal signal, masses, mass effect. No abnormal extra-axial fluid collections. 13 x 6 mm LEFT parafalcine meningioma, new from prior MRI. VASCULAR: Normal major intracranial vascular flow voids present at skull base. SKULL AND UPPER CERVICAL SPINE: No abnormal sellar expansion. Biparietal bur holes better seen on prior CT. No suspicious calvarial bone marrow signal. Craniocervical junction maintained. SINUSES/ORBITS: LEFT maxillary mucosal retention cyst. Mastoid air cells are well aerated. The included ocular globes and orbital contents are non-suspicious. Status post bilateral ocular lens implants. OTHER: None. MRA HEAD FINDINGS ANTERIOR CIRCULATION: Normal flow related enhancement of the included cervical, petrous, cavernous and supraclinoid internal carotid arteries. Patent anterior communicating artery. Patent anterior and middle cerebral arteries, mild luminal irregularity compatible with atherosclerosis. No large vessel occlusion, flow limiting stenosis, aneurysm. POSTERIOR CIRCULATION: LEFT vertebral artery is dominant. Vertebrobasilar arteries are patent, with normal flow related enhancement of the main branch vessels. Patent posterior cerebral arteries, mild luminal irregularity compatible with atherosclerosis. No large vessel occlusion, flow limiting stenosis,  aneurysm. ANATOMIC VARIANTS: Hypoplastic LEFT A1 segment. Source images and MIP images were reviewed. IMPRESSION: MRI HEAD: 1. No acute intracranial process. 2. 13 x 6 mm LEFT parafalcine meningioma without mass effect. 3. Old small LEFT cerebellar infarct, otherwise negative noncontrast MRI head for age. MRA  HEAD: 1. Normal burden large vessel occlusion or flow-limiting stenosis. 2. Mild intracranial atherosclerosis. Electronically Signed   By: Elon Alas M.D.   On: 04/20/2018 18:07   US Abdomen Complete  Result Date: 04/20/2018 CLINICAL DATA:  Elevated bilirubin.  History of cholecystectomy. EXAM: ABDOMEN ULTRASOUND COMPLETE COMPARISON:  MRI abdomen March 06, 2016. FINDINGS: Assessment limited by bowel gas. Gallbladder: Status post cholecystectomy. Common bile duct: Diameter: Dilated at 19 mm, no sonographically identified choledocholithiasis or mass. Liver: Intrahepatic biliary dilatation. No focal lesion identified. Within normal limits in parenchymal echogenicity. Portal vein is patent on color Doppler imaging with normal direction of blood flow towards the liver. IVC: No abnormality visualized. Pancreas: Visualized portion unremarkable. Spleen: Mild suspected splenomegaly, approximately 14 cm. Assessment limited due to bowel gas. Right Kidney: Length: 10.7 cm. Cortical thinning. Echogenicity within normal limits. No mass or hydronephrosis visualized. Left Kidney: Not sonographically identified, likely obscured by bowel gas.  Abdominal aorta: Predominantly obscured by bowel gas, approximately 2 cm proximally. Other findings: None. IMPRESSION: 1. Limited assessment due to bowel gas, LEFT kidney not sonographically identified. 2. Intra and extrahepatic biliary dilatation, greater than expected for postsurgical change. Consider MRCP to evaluate for obstructing stone or mass. 3. Suspected splenomegaly. Electronically Signed   By: Elon Alas M.D.   On: 04/20/2018 16:30    EKG: Independently reviewed.  Sinus bradycardia, rate 57 with artifacts.   Assessment/Plan Principal Problem:   Generalized weakness Active Problems:   Parkinson disease (HCC)   Elevated bilirubin-2.4, other liver enzymes normal, no abdominal pain. Abdominal ultrasound intrahepatic and extrahepatic biliary dilatation  greater than expected for postsurgical change.  MRCP recommended. Dr. Laural Golden by EDP consulted recommended hospitalist admission, IV fluids, MRCP a.m. -IV fluids normal saline +20 KCl 100cc/hr x 1 day - CMP, Fractionated bilirubin in a.m. - GI consult - NPO midnight  Generalized weakness-at baseline patient can get into his wheelchair but this has not been the case in the past few days.  No Focal deficits.  CT without acute intracranial abnormality.  MRI/MRA-acute intracranial process, parafalcine meningioma without mass-effect, normal body and large vessel occlusion or flow-limiting stenosis.  UA chest x-ray not suggestive of infectious etiology. - ? Advanced age versus increasing debility 2/2 parkinsons vs poor PO intake. - hydrate -We will up urine cultures  Parkinsons disease -Continue Sinemet  DVT prophylaxis: Scds pending eval Code Status: Assume full Family Communication: None at bedside Disposition Plan: per rounding team Consults called: None Admission status: Obs, Med- surg   Bethena Roys MD [facility] Pager [phone] From 3PM-11PM.  Otherwise please contact night-coverage [email or web address] Password [email or web address]  04/20/2018, 10:11 PM

## 2018-04-20 NOTE — ED Notes (Signed)
Patient woke up to verbal stimulation and asked where he was at. I reoriented patient and he went back to sleep.

## 2018-04-21 ENCOUNTER — Other Ambulatory Visit: Payer: Self-pay

## 2018-04-21 ENCOUNTER — Encounter (HOSPITAL_COMMUNITY): Payer: Self-pay | Admitting: Gastroenterology

## 2018-04-21 ENCOUNTER — Telehealth: Payer: Self-pay | Admitting: Gastroenterology

## 2018-04-21 ENCOUNTER — Observation Stay (HOSPITAL_COMMUNITY): Payer: Medicare HMO

## 2018-04-21 DIAGNOSIS — R4182 Altered mental status, unspecified: Secondary | ICD-10-CM | POA: Diagnosis not present

## 2018-04-21 DIAGNOSIS — R7989 Other specified abnormal findings of blood chemistry: Secondary | ICD-10-CM

## 2018-04-21 DIAGNOSIS — R531 Weakness: Secondary | ICD-10-CM | POA: Diagnosis not present

## 2018-04-21 DIAGNOSIS — K838 Other specified diseases of biliary tract: Secondary | ICD-10-CM

## 2018-04-21 DIAGNOSIS — R17 Unspecified jaundice: Secondary | ICD-10-CM | POA: Insufficient documentation

## 2018-04-21 DIAGNOSIS — K5730 Diverticulosis of large intestine without perforation or abscess without bleeding: Secondary | ICD-10-CM | POA: Diagnosis not present

## 2018-04-21 DIAGNOSIS — E876 Hypokalemia: Secondary | ICD-10-CM | POA: Diagnosis not present

## 2018-04-21 DIAGNOSIS — R935 Abnormal findings on diagnostic imaging of other abdominal regions, including retroperitoneum: Secondary | ICD-10-CM | POA: Diagnosis not present

## 2018-04-21 DIAGNOSIS — Z7401 Bed confinement status: Secondary | ICD-10-CM | POA: Diagnosis not present

## 2018-04-21 DIAGNOSIS — G20 Parkinson's disease: Secondary | ICD-10-CM

## 2018-04-21 LAB — COMPREHENSIVE METABOLIC PANEL
ALBUMIN: 3.6 g/dL (ref 3.5–5.0)
ALT: 13 U/L (ref 0–44)
AST: 23 U/L (ref 15–41)
Alkaline Phosphatase: 78 U/L (ref 38–126)
Anion gap: 10 (ref 5–15)
BILIRUBIN TOTAL: 2.1 mg/dL — AB (ref 0.3–1.2)
BUN: 27 mg/dL — ABNORMAL HIGH (ref 8–23)
CO2: 31 mmol/L (ref 22–32)
CREATININE: 1.18 mg/dL (ref 0.61–1.24)
Calcium: 8.8 mg/dL — ABNORMAL LOW (ref 8.9–10.3)
Chloride: 100 mmol/L (ref 98–111)
GFR calc non Af Amer: 53 mL/min — ABNORMAL LOW (ref >60)
GLUCOSE: 99 mg/dL (ref 70–99)
Potassium: 3.1 mmol/L — ABNORMAL LOW (ref 3.5–5.1)
SODIUM: 141 mmol/L (ref 135–145)
TOTAL PROTEIN: 6.6 g/dL (ref 6.5–8.1)

## 2018-04-21 LAB — URINE CULTURE: Culture: NO GROWTH

## 2018-04-21 LAB — BILIRUBIN, DIRECT: Bilirubin, Direct: 0.4 mg/dL — ABNORMAL HIGH (ref 0.0–0.2)

## 2018-04-21 MED ORDER — POTASSIUM CHLORIDE IN NACL 20-0.9 MEQ/L-% IV SOLN
INTRAVENOUS | Status: DC
  Administered 2018-04-21: 03:00:00 via INTRAVENOUS
  Filled 2018-04-21: qty 1000

## 2018-04-21 MED ORDER — TAMSULOSIN HCL 0.4 MG PO CAPS: 0.4000 mg | ORAL_CAPSULE | Freq: Every day | ORAL | Status: DC

## 2018-04-21 MED ORDER — ACETAMINOPHEN 650 MG RE SUPP: 650.0000 mg | Freq: Four times a day (QID) | RECTAL | Status: DC | PRN

## 2018-04-21 MED ORDER — ACETAMINOPHEN 325 MG PO TABS: 650.0000 mg | ORAL_TABLET | Freq: Four times a day (QID) | ORAL | Status: DC | PRN

## 2018-04-21 MED ORDER — ONDANSETRON HCL 4 MG PO TABS: 4.0000 mg | ORAL_TABLET | Freq: Four times a day (QID) | ORAL | Status: DC | PRN

## 2018-04-21 MED ORDER — FINASTERIDE 5 MG PO TABS
5.0000 mg | ORAL_TABLET | Freq: Every day | ORAL | Status: DC
  Filled 2018-04-21 (×2): qty 1

## 2018-04-21 MED ORDER — CARBIDOPA-LEVODOPA 25-100 MG PO TABS
2.0000 | ORAL_TABLET | Freq: Three times a day (TID) | ORAL | Status: DC
  Filled 2018-04-21 (×5): qty 2

## 2018-04-21 MED ORDER — POLYETHYLENE GLYCOL 3350 17 G PO PACK: 17.0000 g | PACK | Freq: Every day | ORAL | Status: DC | PRN

## 2018-04-21 MED ORDER — DULOXETINE HCL 30 MG PO CPEP
30.0000 mg | ORAL_CAPSULE | Freq: Every day | ORAL | Status: DC
  Administered 2018-04-21: 30 mg via ORAL
  Filled 2018-04-21: qty 1

## 2018-04-21 MED ORDER — POTASSIUM CHLORIDE CRYS ER 20 MEQ PO TBCR
20.0000 meq | EXTENDED_RELEASE_TABLET | Freq: Every day | ORAL | Status: DC
  Administered 2018-04-21: 20 meq via ORAL
  Filled 2018-04-21: qty 1

## 2018-04-21 MED ORDER — ONDANSETRON HCL 4 MG/2ML IJ SOLN: 4.0000 mg | Freq: Four times a day (QID) | INTRAMUSCULAR | Status: DC | PRN

## 2018-04-21 NOTE — ED Notes (Signed)
Patient's son updated concerning pending discharge. [place] [phone].

## 2018-04-21 NOTE — ED Notes (Signed)
[facility] to give report. Nurses are not answering the phone for report. I leLft my name and number with their secretary if they would like to call me back for report.

## 2018-04-21 NOTE — ED Notes (Signed)
Pharmacy contacted for carbidopa-levodopa. Meds not found in [facility]. Pharmacy to call back and advise.

## 2018-04-21 NOTE — Consult Note (Addendum)
Referring Provider: Barton Dubois, MD Primary Care Physician:  Caryl Bis, MD Primary Gastroenterologist:  Garfield Cornea, MD  Reason for Consultation:  Dilated bile duct, elevated total bilirubin  HPI: Julian Ramirez is a 84 y.o. male who presented from [facility] skilled nursing facility for increasing generalized weakness over the past week, mental status change.  Patient typically gets up into wheelchair and does daily activities but has been unable to do so.  Facility gave IV fluids without change.  Recently completed treatment for pneumonia.  Also with history of Parkinson's disease.  Above history per epic.  In the ED chest x-ray revealed no acute cardiopulmonary disease.  Head CT with stable subcentimeter left frontal falcine nodule, likely meningioma, no acute normalities.  MRA/MRI brain with 13 x 6 mm left parafalcine meningioma without mass-effect, old small left cerebellar infarct.  Abdominal ultrasound performed to evaluate elevated bilirubin.  Noted to have dilated common bile duct at 19 mm without definite choledocholithiasis or mass.  Intrahepatic biliary dilatation as well.  Suspected splenomegaly.  Total bilirubin elevated at 2.4 yesterday, 2.1 today.  It was not differentiated.  Other LFTs normal.  When he presented yesterday his BUN elevated at 36, creatinine 1.44 down from 2.432 weeks ago.  Today down to 1.18.  Urinalysis unremarkable.  Lactic acid normal.  Potassium was 3.3.  CBC normal.  Upon entering his ED room today, patient was standing up at the sink.  He voiced that he was not sure where he was.  Provided reorientation.  Patient called, followed commands.  He is alert and oriented to person, time.  He is aware of where he lives but was not sure what facility he was at.  Plan further expection of the room, patient had pulled out his IV, his blood pressure cuff was off, he was completely unclosed, bed saturated with urine.  He denies abdominal pain.  He is not sure  why he was brought to the emergency department.  He is not sure whether he has been having any difficulty eating or drinking.  He states he feels hungry now.  He denies diarrhea.   Prior to Admission medications   Medication Sig Start Date End Date Taking? Authorizing Provider  Amino Acids-Protein Hydrolys (FEEDING SUPPLEMENT, PRO-STAT SUGAR FREE 64,) LIQD Take 30 mLs by mouth daily.    [provider]  Ascorbic Acid (VITAMIN C) 100 MG tablet Take 100 mg by mouth daily.    [provider]  Baclofen 5 MG TABS Take 5 mg by mouth 2 (two) times daily. 01/31/18   [provider]  carbidopa-levodopa (SINEMET IR) 25-100 MG tablet Take 2 tablets by mouth 3 (three) times daily. 7 am. 11 am. 4 pm. 02/17/18   Tat, Eustace Quail, DO  DULoxetine (CYMBALTA) 30 MG capsule Take 30 mg by mouth daily. 01/30/18   [provider]  finasteride (PROSCAR) 5 MG tablet Take 5 mg by mouth daily.    [provider]  furosemide (LASIX) 20 MG tablet Take by mouth. 3 in the morning, 2 in the evening    [provider]  Menthol (ICY HOT) 5 % PTCH Apply topically.    [provider]  Oxycodone HCl 10 MG TABS Take 10 mg by mouth 2 (two) times daily. 02/01/18   [provider]  polyethylene glycol (MIRALAX / GLYCOLAX) packet Take 17 g by mouth daily.    [provider]  potassium chloride SA (K-DUR,KLOR-CON) 20 MEQ tablet Take 20 mEq by mouth  daily.    [provider]  pregabalin (LYRICA) 100 MG capsule Take 100 mg by mouth 3 (three) times daily. 02/07/18   [provider]  tamsulosin (FLOMAX) 0.4 MG CAPS capsule Take 0.4 mg by mouth at bedtime.    [provider]  traZODone (DESYREL) 50 MG tablet Take 50 mg by mouth at bedtime.    [provider]    Current Facility-Administered Medications  Medication Dose Route Frequency Provider Last Rate Last Dose  . 0.9 % NaCl with KCl 20 mEq/ L  infusion   Intravenous Continuous  Emokpae, Ejiroghene E, MD 100 mL/hr at 04/21/18 0306    . acetaminophen (TYLENOL) tablet 650 mg  650 mg Oral Q6H PRN Emokpae, Ejiroghene E, MD       Or  . acetaminophen (TYLENOL) suppository 650 mg  650 mg Rectal Q6H PRN Emokpae, Ejiroghene E, MD      . carbidopa-levodopa (SINEMET IR) 25-100 MG per tablet immediate release 2 tablet  2 tablet Oral TID Emokpae, Ejiroghene E, MD      . DULoxetine (CYMBALTA) DR capsule 30 mg  30 mg Oral Daily Emokpae, Ejiroghene E, MD      . finasteride (PROSCAR) tablet 5 mg  5 mg Oral Daily Emokpae, Ejiroghene E, MD      . ondansetron (ZOFRAN) tablet 4 mg  4 mg Oral Q6H PRN Emokpae, Ejiroghene E, MD       Or  . ondansetron (ZOFRAN) injection 4 mg  4 mg Intravenous Q6H PRN Emokpae, Ejiroghene E, MD      . polyethylene glycol (MIRALAX / GLYCOLAX) packet 17 g  17 g Oral Daily PRN Emokpae, Ejiroghene E, MD      . potassium chloride SA (K-DUR,KLOR-CON) CR tablet 20 mEq  20 mEq Oral Daily Emokpae, Ejiroghene E, MD      . potassium chloride SA (K-DUR,KLOR-CON) CR tablet 40 mEq  40 mEq Oral Once Bethena Roys, MD   Stopped at 04/20/18 1636  . tamsulosin (FLOMAX) capsule 0.4 mg  0.4 mg Oral QHS Emokpae, Ejiroghene E, MD       Current Outpatient Medications  Medication Sig Dispense Refill  . Amino Acids-Protein Hydrolys (FEEDING SUPPLEMENT, PRO-STAT SUGAR FREE 64,) LIQD Take 30 mLs by mouth daily.    . Ascorbic Acid (VITAMIN C) 100 MG tablet Take 100 mg by mouth daily.    . Baclofen 5 MG TABS Take 5 mg by mouth 2 (two) times daily.    . carbidopa-levodopa (SINEMET IR) 25-100 MG tablet Take 2 tablets by mouth 3 (three) times daily. 7 am. 11 am. 4 pm. 540 tablet 1  . DULoxetine (CYMBALTA) 30 MG capsule Take 30 mg by mouth daily.    . finasteride (PROSCAR) 5 MG tablet Take 5 mg by mouth daily.    . furosemide (LASIX) 20 MG tablet Take by mouth. 3 in the morning, 2 in the evening    . Menthol (ICY HOT) 5 % PTCH Apply topically.    . Oxycodone HCl 10 MG TABS Take 10  mg by mouth 2 (two) times daily.    . polyethylene glycol (MIRALAX / GLYCOLAX) packet Take 17 g by mouth daily.    . potassium chloride SA (K-DUR,KLOR-CON) 20 MEQ tablet Take 20 mEq by mouth daily.    . pregabalin (LYRICA) 100 MG capsule Take 100 mg by mouth 3 (three) times daily.    . tamsulosin (FLOMAX) 0.4 MG CAPS capsule Take 0.4 mg by mouth at bedtime.    Marland Kitchen  traZODone (DESYREL) 50 MG tablet Take 50 mg by mouth at bedtime.      Allergies as of 04/20/2018 - Review Complete 04/20/2018  Allergen Reaction Noted  . Penicillins Shortness Of Breath and Swelling 06/07/2013  . Peanuts [peanut oil] Swelling 06/30/2013  . Wheat bran Swelling 06/29/2013    Past Medical History:  Diagnosis Date  . Chronic pain   . Edema   . Hypertension   . Neuropathy   . Parkinson disease [facility])     Past Surgical History:  Procedure Laterality Date  . BACK SURGERY    . BRAIN SURGERY    . CORONARY ANGIOPLASTY WITH STENT PLACEMENT      Family History  Problem Relation Age of Onset  . Heart attack Mother   . Heart attack Father   . Cancer Sister     Social History   Socioeconomic History  . Marital status: Divorced    Spouse name: Not on file  . Number of children: Not on file  . Years of education: Not on file  . Highest education level: Not on file  Occupational History  . Not on file  Social Needs  . Financial resource strain: Not on file  . Food insecurity:    Worry: Not on file    Inability: Not on file  . Transportation needs:    Medical: Not on file    Non-medical: Not on file  Tobacco Use  . Smoking status: Never Smoker  . Smokeless tobacco: Never Used  Substance and Sexual Activity  . Alcohol use: No  . Drug use: No  . Sexual activity: Not on file  Lifestyle  . Physical activity:    Days per week: Not on file    Minutes per session: Not on file  . Stress: Not on file  Relationships  . Social connections:    Talks on phone: Not on file    Gets together: Not on file     Attends religious service: Not on file    Active member of club or organization: Not on file    Attends meetings of clubs or organizations: Not on file    Relationship status: Not on file  . Intimate partner violence:    Fear of current or ex partner: Not on file    Emotionally abused: Not on file    Physically abused: Not on file    Forced sexual activity: Not on file  Other Topics Concern  . Not on file  Social History Narrative  . Not on file     ROS: Question reliability of history  General: Negative for anorexia, weight loss, fever, chills, fatigue, weakness. Eyes: Negative for vision changes.  ENT: Negative for hoarseness, difficulty swallowing , nasal congestion. CV: Negative for chest pain, angina, palpitations, dyspnea on exertion, peripheral edema.  Respiratory: Negative for dyspnea at rest, dyspnea on exertion, cough, sputum, wheezing.  GI: See history of present illness. GU:  Negative for dysuria, hematuria, urinary incontinence, urinary frequency, nocturnal urination.  MS: Negative for joint pain, low back pain.  Derm: Negative for rash or itching.  Neuro: Negative for weakness, abnormal sensation, seizure, frequent headaches, memory loss, confusion.  Psych: Negative for anxiety, depression, suicidal ideation, hallucinations.  Endo: Negative for unusual weight change.  Heme: Negative for bruising or bleeding. Allergy: Negative for rash or hives.       Physical Examination: Vital signs in last 24 hours: Temp:  [98.2 F (36.8 C)] 98.2 F (36.8 C) (10/30 1044) Pulse Rate:  [  44-92] 67 (10/31 0630) Resp:  [10-22] 18 (10/31 0630) BP: (95-145)/(44-89) 98/47 (10/31 0630) SpO2:  [86 %-100 %] 98 % (10/31 0630) Weight:  [83.9 kg] 83.9 kg (10/30 1039)    General: Well-nourished, well-developed in no acute distress.  Appears younger than stated age. Head: Normocephalic, atraumatic.   Eyes: Conjunctiva pink, no icterus. Mouth: Oropharyngeal mucosa moist and pink , no  lesions erythema or exudate. Neck: Supple without thyromegaly, masses, or lymphadenopathy.  Lungs: Clear to auscultation bilaterally.  Heart: Regular rate and rhythm, no murmurs rubs or gallops.  Abdomen: Bowel sounds are normal, nontender, nondistended, no hepatosplenomegaly or masses, no abdominal bruits or    hernia , no rebound or guarding.   Rectal: Not performed Extremities: No lower extremity edema, clubbing, deformity.  Neuro: Alert and oriented x 4 , grossly normal neurologically.  Skin: Warm and dry, no rash or jaundice.   Psych: Alert and cooperative, normal mood and affect.        Intake/Output from previous day: 10/30 0701 - 10/31 0700 In: 1911.9 [I.V.:685.9; IV Piggyback:1226] Out: 450 [Urine:450] Intake/Output this shift: No intake/output data recorded.  Lab Results: CBC Recent Labs    04/20/18 1041  WBC 8.7  HGB 13.2  HCT 40.6  MCV 88.5  PLT 174   BMET Recent Labs    04/20/18 1041 04/21/18 0446  NA 138 141  K 3.3* 3.1*  CL 90* 100  CO2 34* 31  GLUCOSE 100* 99  BUN 36* 27*  CREATININE 1.44* 1.18  CALCIUM 9.4 8.8*   LFT Recent Labs    04/20/18 1041 04/21/18 0446  BILITOT 2.4* 2.1*  ALKPHOS 95 78  AST 29 23  ALT 8 13  PROT 7.8 6.6  ALBUMIN 4.4 3.6    Lipase No results for input(s): LIPASE in the last 72 hours.  PT/INR No results for input(s): LABPROT, INR in the last 72 hours.    Imaging Studies: Dg Chest 1 View  Result Date: 04/20/2018 CLINICAL DATA:  Weakness, known Parkinson's disease. History of coronary artery disease with stent placement. EXAM: CHEST  1 VIEW COMPARISON:  Chest x-ray of May 25th 2018 FINDINGS: The lungs are adequately inflated and clear. The heart and pulmonary vascularity are normal. The mediastinum is normal in width. There is calcification in the wall of the aortic arch. The observed bony thorax is unremarkable. IMPRESSION: There is no active cardiopulmonary disease. Thoracic aortic atherosclerosis.  Electronically Signed   By: David  Martinique M.D.   On: 04/20/2018 13:13   Ct Head Wo Contrast  Result Date: 04/20/2018 CLINICAL DATA:  Muscle weakness.  Decreased level of consciousness. EXAM: CT HEAD WITHOUT CONTRAST TECHNIQUE: Contiguous axial images were obtained from the base of the skull through the vertex without intravenous contrast. COMPARISON:  11/13/2016. FINDINGS: Brain: No evidence of acute infarction, hemorrhage, hydrocephalus, extra-axial collection or mass effect. There is an extra-axial hyperdense nodule along the left side of the anterior falx measuring 6 mm, image 24/2. Unchanged from 11/13/2016. there is prominence of the sulci and ventricles compatible with brain atrophy. There is mild diffuse low-attenuation within the subcortical and periventricular white matter compatible with chronic microvascular disease. Vascular: No hyperdense vessel or unexpected calcification. Skull: Normal. Negative for fracture or focal lesion. Old burr hole defect is identified within the right posterior parietal bone. Sinuses/Orbits: Retention cyst versus polyp identified in the left maxillary sinus. No mucosal thickening or air-fluid levels identified within the sinuses. Mastoid air cells are clear. Other: None IMPRESSION: 1. No acute intracranial  abnormalities. 2. Chronic small vessel ischemic disease and brain atrophy. 3. Stable subcentimeter left frontal falcine nodule, likely meningioma. Electronically Signed   By: Kerby Moors M.D.   On: 04/20/2018 13:28   Mr Jodene Nam Head Wo Contrast  Result Date: 04/20/2018 CLINICAL DATA:  Altered mental status for a few days. History of Parkinson's disease, brain surgery and hypertension. EXAM: MRI HEAD WITHOUT CONTRAST MRA HEAD WITHOUT CONTRAST TECHNIQUE: Multiplanar, multiecho pulse sequences of the brain and surrounding structures were obtained without intravenous contrast. Angiographic images of the head were obtained using MRA technique without contrast.  COMPARISON:  CT HEAD April 20, 2018 and MRI head June 29, 2013 FINDINGS: MRI HEAD FINDINGS-multiple sequences are moderately motion degraded. INTRACRANIAL CONTENTS: No reduced diffusion to suggest acute ischemia. No susceptibility artifact to suggest hemorrhage. The ventricles and sulci are normal for patient's age. Old small LEFT cerebellar infarct. No suspicious parenchymal signal, masses, mass effect. No abnormal extra-axial fluid collections. 13 x 6 mm LEFT parafalcine meningioma, new from prior MRI. VASCULAR: Normal major intracranial vascular flow voids present at skull base. SKULL AND UPPER CERVICAL SPINE: No abnormal sellar expansion. Biparietal bur holes better seen on prior CT. No suspicious calvarial bone marrow signal. Craniocervical junction maintained. SINUSES/ORBITS: LEFT maxillary mucosal retention cyst. Mastoid air cells are well aerated. The included ocular globes and orbital contents are non-suspicious. Status post bilateral ocular lens implants. OTHER: None. MRA HEAD FINDINGS ANTERIOR CIRCULATION: Normal flow related enhancement of the included cervical, petrous, cavernous and supraclinoid internal carotid arteries. Patent anterior communicating artery. Patent anterior and middle cerebral arteries, mild luminal irregularity compatible with atherosclerosis. No large vessel occlusion, flow limiting stenosis, aneurysm. POSTERIOR CIRCULATION: LEFT vertebral artery is dominant. Vertebrobasilar arteries are patent, with normal flow related enhancement of the main branch vessels. Patent posterior cerebral arteries, mild luminal irregularity compatible with atherosclerosis. No large vessel occlusion, flow limiting stenosis,  aneurysm. ANATOMIC VARIANTS: Hypoplastic LEFT A1 segment. Source images and MIP images were reviewed. IMPRESSION: MRI HEAD: 1. No acute intracranial process. 2. 13 x 6 mm LEFT parafalcine meningioma without mass effect. 3. Old small LEFT cerebellar infarct, otherwise negative  noncontrast MRI head for age. MRA HEAD: 1. Normal burden large vessel occlusion or flow-limiting stenosis. 2. Mild intracranial atherosclerosis. Electronically Signed   By: Elon Alas M.D.   On: 04/20/2018 18:07   Mr Brain Wo Contrast (neuro Protocol)  Result Date: 04/20/2018 CLINICAL DATA:  Altered mental status for a few days. History of Parkinson's disease, brain surgery and hypertension. EXAM: MRI HEAD WITHOUT CONTRAST MRA HEAD WITHOUT CONTRAST TECHNIQUE: Multiplanar, multiecho pulse sequences of the brain and surrounding structures were obtained without intravenous contrast. Angiographic images of the head were obtained using MRA technique without contrast. COMPARISON:  CT HEAD April 20, 2018 and MRI head June 29, 2013 FINDINGS: MRI HEAD FINDINGS-multiple sequences are moderately motion degraded. INTRACRANIAL CONTENTS: No reduced diffusion to suggest acute ischemia. No susceptibility artifact to suggest hemorrhage. The ventricles and sulci are normal for patient's age. Old small LEFT cerebellar infarct. No suspicious parenchymal signal, masses, mass effect. No abnormal extra-axial fluid collections. 13 x 6 mm LEFT parafalcine meningioma, new from prior MRI. VASCULAR: Normal major intracranial vascular flow voids present at skull base. SKULL AND UPPER CERVICAL SPINE: No abnormal sellar expansion. Biparietal bur holes better seen on prior CT. No suspicious calvarial bone marrow signal. Craniocervical junction maintained. SINUSES/ORBITS: LEFT maxillary mucosal retention cyst. Mastoid air cells are well aerated. The included ocular globes and orbital contents are non-suspicious. Status post  bilateral ocular lens implants. OTHER: None. MRA HEAD FINDINGS ANTERIOR CIRCULATION: Normal flow related enhancement of the included cervical, petrous, cavernous and supraclinoid internal carotid arteries. Patent anterior communicating artery. Patent anterior and middle cerebral arteries, mild luminal  irregularity compatible with atherosclerosis. No large vessel occlusion, flow limiting stenosis, aneurysm. POSTERIOR CIRCULATION: LEFT vertebral artery is dominant. Vertebrobasilar arteries are patent, with normal flow related enhancement of the main branch vessels. Patent posterior cerebral arteries, mild luminal irregularity compatible with atherosclerosis. No large vessel occlusion, flow limiting stenosis,  aneurysm. ANATOMIC VARIANTS: Hypoplastic LEFT A1 segment. Source images and MIP images were reviewed. IMPRESSION: MRI HEAD: 1. No acute intracranial process. 2. 13 x 6 mm LEFT parafalcine meningioma without mass effect. 3. Old small LEFT cerebellar infarct, otherwise negative noncontrast MRI head for age. MRA HEAD: 1. Normal burden large vessel occlusion or flow-limiting stenosis. 2. Mild intracranial atherosclerosis. Electronically Signed   By: Elon Alas M.D.   On: 04/20/2018 18:07   US Abdomen Complete  Result Date: 04/20/2018 CLINICAL DATA:  Elevated bilirubin.  History of cholecystectomy. EXAM: ABDOMEN ULTRASOUND COMPLETE COMPARISON:  MRI abdomen March 06, 2016. FINDINGS: Assessment limited by bowel gas. Gallbladder: Status post cholecystectomy. Common bile duct: Diameter: Dilated at 19 mm, no sonographically identified choledocholithiasis or mass. Liver: Intrahepatic biliary dilatation. No focal lesion identified. Within normal limits in parenchymal echogenicity. Portal vein is patent on color Doppler imaging with normal direction of blood flow towards the liver. IVC: No abnormality visualized. Pancreas: Visualized portion unremarkable. Spleen: Mild suspected splenomegaly, approximately 14 cm. Assessment limited due to bowel gas. Right Kidney: Length: 10.7 cm. Cortical thinning. Echogenicity within normal limits. No mass or hydronephrosis visualized. Left Kidney: Not sonographically identified, likely obscured by bowel gas. Abdominal aorta: Predominantly obscured by bowel gas,  approximately 2 cm proximally. Other findings: None. IMPRESSION: 1. Limited assessment due to bowel gas, LEFT kidney not sonographically identified. 2. Intra and extrahepatic biliary dilatation, greater than expected for postsurgical change. Consider MRCP to evaluate for obstructing stone or mass. 3. Suspected splenomegaly. Electronically Signed   By: Elon Alas M.D.   On: 04/20/2018 16:30  [4 week]   Impression: 84 year old gentleman with history of hypertension, Parkinson's disease who presented from [facility] skilled nursing facility for mental status change and increasing generalized weakness that did not respond to IV fluids.  Reportedly patient was treated within the past couple weeks for pneumonia.  It is unclear whether he has had any abdominal pain or diminished oral intake.   We were consulted for intra/extrahepatic biliary dilation. In the ED he is found to have an elevated total bilirubin of 2.4, not differentiated.  Abdominal ultrasound showed intrahepatic biliary dilatation as well as common bile duct of 19 mm, greater than expected for postsurgical change.  He is chronically on pain medication which can contribute to biliary dilation.  Plan: 1. Differentiate total bilirubin. 2. Agree with MRCP. Will make NPO for preparation. Patient currently has no IV access. Nursing aware.   We would like to thank you for the opportunity to participate in the care of Julian Ramirez.  Laureen Ochs. Bernarda Caffey [facility] [phone] 10/31/20199:10 AM     LOS: 0 days

## 2018-04-21 NOTE — ED Notes (Signed)
Spoke with Wells Guiles with EMS scheduler will send for a transport truck to transfer pt back to [facility].

## 2018-04-21 NOTE — Progress Notes (Addendum)
MRCP today: IMPRESSION: 1. Intra- and extrahepatic biliary dilatation is stable from the 03/06/2016 MRI exam. Presumably this is due to a combination of patient age and physiologic dilatation related to prior cholecystectomy. No obstructing lesion is identified. Please note that sensitivity and negative predictive value are adversely affected by the severe degree of motion artifact on today's exam. 2. Superior endplate compression fracture at L2 was not present on 03/12/2017, and may be subacute. 3. Other imaging findings of potential clinical significance: Aortic Atherosclerosis (ICD10-I70.0). Infrarenal IVC filter. Sigmoid colon diverticulosis. Dextroconvex lumbar scoliosis. Left renal parapelvic cysts. Right lower lobe scarring or atelectasis.    Reviewed additional records from [facility] abdomen/MRCP in September 2017 extrahepatic common duct measured 16 mm, common bile duct measures 12 mm, some central flow artifact in the distal duct but no choledocholithiasis.  Gallstones seen within the gallbladder.  Pancreas unremarkable.  HIDA in September 2017 partial patency of dilated CBD, nonvisualization of gallbladder despite delayed imaging through 4 hours consistent with cystic duct obstruction, acute cholecystitis.  Abdominal ultrasound September 2017 with intrahepatic and extrahepatic biliary dilation, CBD 2.1 cm.  Cholelithiasis with asymmetric gallbladder wall thickening and edema.  Question large sludge ball within the gallbladder.   Reviewed add on direct bilirubin, slightly elevated at 0.4.  Elevated total bilirubin mostly indirect bilirubin.  Discussed findings with Dr. Gala Romney, given benign abdominal exam, chronic findings as outlined, will no need for admission regarding biliary dilation.  Likely physiological given age, status post cholecystectomy, chronic narcotics.  Will plan to repeat LFTs first of the week.  Information communicated to Dr. Quentin Cornwall S. Bernarda Caffey [facility] [phone] 10/31/201911:20 AM  Attending note:  Discussed with Dr. Dyann Kief.  Agree with plan as outlined.

## 2018-04-21 NOTE — ED Notes (Signed)
Per MD Dyann Kief, hold off on IV access until Gi is spoken to regarding pending discharge.  Will call back and update for further notice.

## 2018-04-21 NOTE — ED Notes (Signed)
Report given to Medical sales representative at [facility].

## 2018-04-21 NOTE — ED Notes (Signed)
Dyann Kief confirmed the patient will be discharged.

## 2018-04-21 NOTE — Telephone Encounter (Signed)
Noted. Spoke with pts nurse Caryl Pina. She asked that lab orders be faxed to [facility] [phone]. Orders were faxed.

## 2018-04-21 NOTE — ED Notes (Signed)
Pharmacy contacted for overdue meds- they will bring as soon as possible.

## 2018-04-21 NOTE — Telephone Encounter (Signed)
These make arrangements for LFTs first of next week.  Patient resides at [facility]. Dx: abnormal bilirubin, biliary dilation.

## 2018-04-21 NOTE — ED Notes (Signed)
[facility] delivered Pt's dentures to [facility] from [facility].

## 2018-04-21 NOTE — Discharge Summary (Signed)
Physician Discharge Summary  Julian Ramirez IRW:[id number] DOB: 1927-04-16 DOA: 04/20/2018  PCP: Caryl Bis, MD  Admit date: 04/20/2018 Discharge date: 04/21/2018  Time spent: 35 minutes  Recommendations for Outpatient Follow-up:  1. Repeat CMET in 10 days to follow LFT's, renal function and electrolytes.   Discharge Diagnoses:  Principal Problem:   Generalized weakness Active Problems:   Parkinson disease ([place]) elevated bilirubin  CKD stage 3 Dehydration BPH  Discharge Condition: stable and improved. Will discharge to SNF for further care and rehabilitaiton.   Diet recommendation: heart healthy diet   Filed Weights   04/20/18 1039  Weight: 83.9 kg    History of present illness:  As per H&P by Dr. Denton Brick 04/20/2018 84 y.o. male with medical history significant for HTN, parkinsons, was brought to the ED with complaints of generalized weakness worse especially over the past 2 days but family notes a steady decline over the past at least 1 week.  Patient to me denies chest pain.  No nausea no vomiting no abdominal pain.  Denies dysuria.  Patient has been too weak to get into his wheelchair, as he normally does.  He just finished a course of antibiotics for pneumonia.   ED Course: Soft to stable blood pressure. Heart rates 50s.  Unremarkable CBC.  K mildly low 3.8.  Creatinine 1.4, improving from last creatinine on file 2.4.  Bilirubin elevated at 2.4.  Patient was given 1 L bolus normal saline.  55meq KCl.  Portable chest x-ray CT head negative for acute abnormality.  Renal ultrasound-intra-and extrahepatic biliary dilatation greater than expected for postsurgical change, MRCP recommended to evaluate for obstructing stone or mass.  MRI MRA brain negative for acute abnormality, parafalcine meningioma without mass-effect. EDP talked to gastroenterologist Dr. Rehman-recommended hospitalist admission, for CBD evaluation by MRCP tomorrow.,  Trend enzymes, and hydrate.  Hospital  Course:  1-generalized weakness:  -Patient with recent pneumonia that has been appropriately treated and not having any signs of ongoing infection. -Underlying history of Parkinson disease of mild dehydration -Most likely having ongoing deconditioning. -Physical therapy as per the skilled nursing facility protocol -Make sure patient is having good hydration and good p.o. Intake.  2-elevated bilirubin at 2.4 -No jaundice, no nausea, no vomiting, no abdominal pain. -Patient with a prior history of cholecystectomy -Abdominal ultrasound demonstrated bile ducts dilatation and there was concern for potential stone retention/obstruction -MRCP has been done and is negative for any acute abnormalities -GI service consulted and recommended no further work-up. -Patient most likely with mild Gilbert disease. -repeat CMET in 10 days to follow LFT's  3-Parkinson disease -Continue Sinemet  4-dehydration and hypokalemia -in the setting of poor oral intake -hydrated with IVF's -K 3.1 -patient discharge on PO potassium supplementation -repeat CMET as mentioned above to follow electrolytes trend  5-BPH -no urinary retention symptoms  -UA no suggesting UTI -continue home meds.  6-depression/insomnia -continue trazodone   7-CKD stage 3 -base on GFR stage 3 at baseline  -improved and most likely back to baseline after IVF's -patient encourage to maintain adequate hydration.  Procedures:  See below for x-ray reports.  Consultations:  GI  Discharge Exam: Vitals:   04/21/18 0600 04/21/18 0630  BP: (!) 103/52 (!) 98/47  Pulse: 65 67  Resp: 13 18  Temp:    SpO2: 98% 98%    General: Afebrile, no chest pain, no nausea, no vomiting.  Patient alert and oriented X3.  No acute complaints. Cardiovascular: S1 and S2, no rubs, no gallops, no  JVD. Respiratory: Good air movement bilaterally, no wheezing, no crackles. Abdomen: Soft, nontender, nondistended, no guarding, positive bowel  sounds. Extremities: no edema, no cyanosis, no clubbing; MS 4/5 bilaterally due to poor effort.  Discharge Instructions   Discharge Instructions    Diet - low sodium heart healthy   Complete by:  As directed    Discharge instructions   Complete by:  As directed    Physical rehabilitation as per skilled nursing facility protocol Maintain adequate hydration and good p.o. intake Outpatient follow-up with PCP in 10 days Repeat CMET in 10 days to assure bilirubin, renal function and electrolytes are stable and back to normal.     Allergies as of 04/21/2018      Reactions   Penicillins Shortness Of Breath, Swelling   Mouth swells   Peanuts [peanut Oil] Swelling   Causes swelling in the lower body areas.    Wheat Bran Swelling   Wheat four causes swelling in the lower body areas       Medication List    STOP taking these medications   Oxycodone HCl 10 MG Tabs     TAKE these medications   Baclofen 5 MG Tabs Take 5 mg by mouth 2 (two) times daily.   carbidopa-levodopa 25-100 MG tablet Commonly known as:  SINEMET IR Take 2 tablets by mouth 3 (three) times daily. 7 am. 11 am. 4 pm.   DULoxetine 30 MG capsule Commonly known as:  CYMBALTA Take 30 mg by mouth daily.   feeding supplement (PRO-STAT SUGAR FREE 64) Liqd Take 30 mLs by mouth daily.   finasteride 5 MG tablet Commonly known as:  PROSCAR Take 5 mg by mouth daily.   furosemide 20 MG tablet Commonly known as:  LASIX Take by mouth. 3 in the morning, 2 in the evening   ICY HOT 5 % Ptch Generic drug:  Menthol Apply topically.   polyethylene glycol packet Commonly known as:  MIRALAX / GLYCOLAX Take 17 g by mouth daily.   potassium chloride SA 20 MEQ tablet Commonly known as:  K-DUR,KLOR-CON Take 20 mEq by mouth daily.   pregabalin 100 MG capsule Commonly known as:  LYRICA Take 100 mg by mouth 3 (three) times daily.   tamsulosin 0.4 MG Caps capsule Commonly known as:  FLOMAX Take 0.4 mg by mouth at  bedtime.   traZODone 50 MG tablet Commonly known as:  DESYREL Take 50 mg by mouth at bedtime.   vitamin C 100 MG tablet Take 100 mg by mouth daily.      Allergies  Allergen Reactions  . Penicillins Shortness Of Breath and Swelling    Mouth swells   . Peanuts [Peanut Oil] Swelling    Causes swelling in the lower body areas.   . Wheat Bran Swelling    Wheat four causes swelling in the lower body areas    Follow-up Information    Caryl Bis, MD. Schedule an appointment as soon as possible for a visit in 10 day(s).   Specialty:  Family Medicine Contact information: [place] [address] [phone]           The results of significant diagnostics from this hospitalization (including imaging, microbiology, ancillary and laboratory) are listed below for reference.    Significant Diagnostic Studies: Dg Chest 1 View  Result Date: 04/20/2018 CLINICAL DATA:  Weakness, known Parkinson's disease. History of coronary artery disease with stent placement. EXAM: CHEST  1 VIEW COMPARISON:  Chest x-ray of May 25th  2018 FINDINGS: The lungs are adequately inflated and clear. The heart and pulmonary vascularity are normal. The mediastinum is normal in width. There is calcification in the wall of the aortic arch. The observed bony thorax is unremarkable. IMPRESSION: There is no active cardiopulmonary disease. Thoracic aortic atherosclerosis. Electronically Signed   By: David  Martinique M.D.   On: 04/20/2018 13:13   Ct Head Wo Contrast  Result Date: 04/20/2018 CLINICAL DATA:  Muscle weakness.  Decreased level of consciousness. EXAM: CT HEAD WITHOUT CONTRAST TECHNIQUE: Contiguous axial images were obtained from the base of the skull through the vertex without intravenous contrast. COMPARISON:  11/13/2016. FINDINGS: Brain: No evidence of acute infarction, hemorrhage, hydrocephalus, extra-axial collection or mass effect. There is an extra-axial hyperdense nodule along the left side of  the anterior falx measuring 6 mm, image 24/2. Unchanged from 11/13/2016. there is prominence of the sulci and ventricles compatible with brain atrophy. There is mild diffuse low-attenuation within the subcortical and periventricular white matter compatible with chronic microvascular disease. Vascular: No hyperdense vessel or unexpected calcification. Skull: Normal. Negative for fracture or focal lesion. Old burr hole defect is identified within the right posterior parietal bone. Sinuses/Orbits: Retention cyst versus polyp identified in the left maxillary sinus. No mucosal thickening or air-fluid levels identified within the sinuses. Mastoid air cells are clear. Other: None IMPRESSION: 1. No acute intracranial abnormalities. 2. Chronic small vessel ischemic disease and brain atrophy. 3. Stable subcentimeter left frontal falcine nodule, likely meningioma. Electronically Signed   By: Kerby Moors M.D.   On: 04/20/2018 13:28   Mr Jodene Nam Head Wo Contrast  Result Date: 04/20/2018 CLINICAL DATA:  Altered mental status for a few days. History of Parkinson's disease, brain surgery and hypertension. EXAM: MRI HEAD WITHOUT CONTRAST MRA HEAD WITHOUT CONTRAST TECHNIQUE: Multiplanar, multiecho pulse sequences of the brain and surrounding structures were obtained without intravenous contrast. Angiographic images of the head were obtained using MRA technique without contrast. COMPARISON:  CT HEAD April 20, 2018 and MRI head June 29, 2013 FINDINGS: MRI HEAD FINDINGS-multiple sequences are moderately motion degraded. INTRACRANIAL CONTENTS: No reduced diffusion to suggest acute ischemia. No susceptibility artifact to suggest hemorrhage. The ventricles and sulci are normal for patient's age. Old small LEFT cerebellar infarct. No suspicious parenchymal signal, masses, mass effect. No abnormal extra-axial fluid collections. 13 x 6 mm LEFT parafalcine meningioma, new from prior MRI. VASCULAR: Normal major intracranial vascular  flow voids present at skull base. SKULL AND UPPER CERVICAL SPINE: No abnormal sellar expansion. Biparietal bur holes better seen on prior CT. No suspicious calvarial bone marrow signal. Craniocervical junction maintained. SINUSES/ORBITS: LEFT maxillary mucosal retention cyst. Mastoid air cells are well aerated. The included ocular globes and orbital contents are non-suspicious. Status post bilateral ocular lens implants. OTHER: None. MRA HEAD FINDINGS ANTERIOR CIRCULATION: Normal flow related enhancement of the included cervical, petrous, cavernous and supraclinoid internal carotid arteries. Patent anterior communicating artery. Patent anterior and middle cerebral arteries, mild luminal irregularity compatible with atherosclerosis. No large vessel occlusion, flow limiting stenosis, aneurysm. POSTERIOR CIRCULATION: LEFT vertebral artery is dominant. Vertebrobasilar arteries are patent, with normal flow related enhancement of the main branch vessels. Patent posterior cerebral arteries, mild luminal irregularity compatible with atherosclerosis. No large vessel occlusion, flow limiting stenosis,  aneurysm. ANATOMIC VARIANTS: Hypoplastic LEFT A1 segment. Source images and MIP images were reviewed. IMPRESSION: MRI HEAD: 1. No acute intracranial process. 2. 13 x 6 mm LEFT parafalcine meningioma without mass effect. 3. Old small LEFT cerebellar infarct, otherwise negative noncontrast MRI  head for age. MRA HEAD: 1. Normal burden large vessel occlusion or flow-limiting stenosis. 2. Mild intracranial atherosclerosis. Electronically Signed   By: Elon Alas M.D.   On: 04/20/2018 18:07   Mr Brain Wo Contrast (neuro Protocol)  Result Date: 04/20/2018 CLINICAL DATA:  Altered mental status for a few days. History of Parkinson's disease, brain surgery and hypertension. EXAM: MRI HEAD WITHOUT CONTRAST MRA HEAD WITHOUT CONTRAST TECHNIQUE: Multiplanar, multiecho pulse sequences of the brain and surrounding structures were  obtained without intravenous contrast. Angiographic images of the head were obtained using MRA technique without contrast. COMPARISON:  CT HEAD April 20, 2018 and MRI head June 29, 2013 FINDINGS: MRI HEAD FINDINGS-multiple sequences are moderately motion degraded. INTRACRANIAL CONTENTS: No reduced diffusion to suggest acute ischemia. No susceptibility artifact to suggest hemorrhage. The ventricles and sulci are normal for patient's age. Old small LEFT cerebellar infarct. No suspicious parenchymal signal, masses, mass effect. No abnormal extra-axial fluid collections. 13 x 6 mm LEFT parafalcine meningioma, new from prior MRI. VASCULAR: Normal major intracranial vascular flow voids present at skull base. SKULL AND UPPER CERVICAL SPINE: No abnormal sellar expansion. Biparietal bur holes better seen on prior CT. No suspicious calvarial bone marrow signal. Craniocervical junction maintained. SINUSES/ORBITS: LEFT maxillary mucosal retention cyst. Mastoid air cells are well aerated. The included ocular globes and orbital contents are non-suspicious. Status post bilateral ocular lens implants. OTHER: None. MRA HEAD FINDINGS ANTERIOR CIRCULATION: Normal flow related enhancement of the included cervical, petrous, cavernous and supraclinoid internal carotid arteries. Patent anterior communicating artery. Patent anterior and middle cerebral arteries, mild luminal irregularity compatible with atherosclerosis. No large vessel occlusion, flow limiting stenosis, aneurysm. POSTERIOR CIRCULATION: LEFT vertebral artery is dominant. Vertebrobasilar arteries are patent, with normal flow related enhancement of the main branch vessels. Patent posterior cerebral arteries, mild luminal irregularity compatible with atherosclerosis. No large vessel occlusion, flow limiting stenosis,  aneurysm. ANATOMIC VARIANTS: Hypoplastic LEFT A1 segment. Source images and MIP images were reviewed. IMPRESSION: MRI HEAD: 1. No acute intracranial process.  2. 13 x 6 mm LEFT parafalcine meningioma without mass effect. 3. Old small LEFT cerebellar infarct, otherwise negative noncontrast MRI head for age. MRA HEAD: 1. Normal burden large vessel occlusion or flow-limiting stenosis. 2. Mild intracranial atherosclerosis. Electronically Signed   By: Elon Alas M.D.   On: 04/20/2018 18:07   US Abdomen Complete  Result Date: 04/20/2018 CLINICAL DATA:  Elevated bilirubin.  History of cholecystectomy. EXAM: ABDOMEN ULTRASOUND COMPLETE COMPARISON:  MRI abdomen March 06, 2016. FINDINGS: Assessment limited by bowel gas. Gallbladder: Status post cholecystectomy. Common bile duct: Diameter: Dilated at 19 mm, no sonographically identified choledocholithiasis or mass. Liver: Intrahepatic biliary dilatation. No focal lesion identified. Within normal limits in parenchymal echogenicity. Portal vein is patent on color Doppler imaging with normal direction of blood flow towards the liver. IVC: No abnormality visualized. Pancreas: Visualized portion unremarkable. Spleen: Mild suspected splenomegaly, approximately 14 cm. Assessment limited due to bowel gas. Right Kidney: Length: 10.7 cm. Cortical thinning. Echogenicity within normal limits. No mass or hydronephrosis visualized. Left Kidney: Not sonographically identified, likely obscured by bowel gas. Abdominal aorta: Predominantly obscured by bowel gas, approximately 2 cm proximally. Other findings: None. IMPRESSION: 1. Limited assessment due to bowel gas, LEFT kidney not sonographically identified. 2. Intra and extrahepatic biliary dilatation, greater than expected for postsurgical change. Consider MRCP to evaluate for obstructing stone or mass. 3. Suspected splenomegaly. Electronically Signed   By: Elon Alas M.D.   On: 04/20/2018 16:30   Mr Abdomen Mrcp  Wo Contrast  Result Date: 04/21/2018 CLINICAL DATA:  Abnormal liver function tests. Biliary dilatation on 04/20/2018 ultrasound EXAM: MRI ABDOMEN WITHOUT  CONTRAST  (INCLUDING MRCP) TECHNIQUE: Multiplanar multisequence MR imaging of the abdomen was performed. Heavily T2-weighted images of the biliary and pancreatic ducts were obtained, and three-dimensional MRCP images were rendered by post processing. COMPARISON:  04/20/2018 and abdominal MRI from 03/06/2016 FINDINGS: Despite efforts by the technologist and patient, markedly severe motion artifact is present on today's exam and could not be eliminated. This reduces exam sensitivity and specificity. Lower chest: Bandlike density in the right lower lobe probably from atelectasis or scarring. Hepatobiliary: Intrahepatic biliary dilatation is observed. The common hepatic duct measures up to 17 mm in diameter. Mildly low position of the cystic duct attachment. The cystic duct remnant is mildly dilated. The common bile duct measures up to 1.3 cm in diameter on image 35/9, and appears to taper down into the ampullary region without observed filling defect on series 9. Series 9 appears to be are best series 4 evaluating the biliary system as it is less affected by motion than some of the other series. The dedicated MRCP images are totally nondiagnostic based on motion. When I compare back to the prior MRI from 03/06/2016, there is a similar degree of intrahepatic biliary dilatation with the common hepatic duct measuring at 17 mm and the common bile duct at 13 mm, similar to today's exam. Pancreas:  Unremarkable Spleen:  Unremarkable Adrenals/Urinary Tract: The adrenal glands appear normal. Numerous tiny T2 hyperintensities in renal parenchyma are likely small cysts. Left renal parapelvic cysts are again observed. Borderline left hydronephrosis without specific cause identified. No proximal hydroureter. Stomach/Bowel: Sigmoid colon diverticulosis. Vascular/Lymphatic: Aortoiliac atherosclerotic vascular disease. Infrarenal IVC filter. Other:  No supplemental non-categorized findings. Musculoskeletal: Dextroconvex lumbar  scoliosis. Degenerative disc disease in the lumbar spine with multilevel degenerative endplate findings. Superior endplate compression fracture at the L2 level is not readily apparent on 03/12/2017 may be subacute. Chronic mild anterior wedging at L3. IMPRESSION: 1. Intra- and extrahepatic biliary dilatation is stable from the 03/06/2016 MRI exam. Presumably this is due to a combination of patient age and physiologic dilatation related to prior cholecystectomy. No obstructing lesion is identified. Please note that sensitivity and negative predictive value are adversely affected by the severe degree of motion artifact on today's exam. 2. Superior endplate compression fracture at L2 was not present on 03/12/2017, and may be subacute. 3. Other imaging findings of potential clinical significance: Aortic Atherosclerosis (ICD10-I70.0). Infrarenal IVC filter. Sigmoid colon diverticulosis. Dextroconvex lumbar scoliosis. Left renal parapelvic cysts. Right lower lobe scarring or atelectasis. Electronically Signed   By: Van Clines M.D.   On: 04/21/2018 10:21   Mr 3d Recon At Scanner  Result Date: 04/21/2018 CLINICAL DATA:  Abnormal liver function tests. Biliary dilatation on 04/20/2018 ultrasound EXAM: MRI ABDOMEN WITHOUT CONTRAST  (INCLUDING MRCP) TECHNIQUE: Multiplanar multisequence MR imaging of the abdomen was performed. Heavily T2-weighted images of the biliary and pancreatic ducts were obtained, and three-dimensional MRCP images were rendered by post processing. COMPARISON:  04/20/2018 and abdominal MRI from 03/06/2016 FINDINGS: Despite efforts by the technologist and patient, markedly severe motion artifact is present on today's exam and could not be eliminated. This reduces exam sensitivity and specificity. Lower chest: Bandlike density in the right lower lobe probably from atelectasis or scarring. Hepatobiliary: Intrahepatic biliary dilatation is observed. The common hepatic duct measures up to 17 mm in  diameter. Mildly low position of the cystic duct attachment. The cystic  duct remnant is mildly dilated. The common bile duct measures up to 1.3 cm in diameter on image 35/9, and appears to taper down into the ampullary region without observed filling defect on series 9. Series 9 appears to be are best series 4 evaluating the biliary system as it is less affected by motion than some of the other series. The dedicated MRCP images are totally nondiagnostic based on motion. When I compare back to the prior MRI from 03/06/2016, there is a similar degree of intrahepatic biliary dilatation with the common hepatic duct measuring at 17 mm and the common bile duct at 13 mm, similar to today's exam. Pancreas:  Unremarkable Spleen:  Unremarkable Adrenals/Urinary Tract: The adrenal glands appear normal. Numerous tiny T2 hyperintensities in renal parenchyma are likely small cysts. Left renal parapelvic cysts are again observed. Borderline left hydronephrosis without specific cause identified. No proximal hydroureter. Stomach/Bowel: Sigmoid colon diverticulosis. Vascular/Lymphatic: Aortoiliac atherosclerotic vascular disease. Infrarenal IVC filter. Other:  No supplemental non-categorized findings. Musculoskeletal: Dextroconvex lumbar scoliosis. Degenerative disc disease in the lumbar spine with multilevel degenerative endplate findings. Superior endplate compression fracture at the L2 level is not readily apparent on 03/12/2017 may be subacute. Chronic mild anterior wedging at L3. IMPRESSION: 1. Intra- and extrahepatic biliary dilatation is stable from the 03/06/2016 MRI exam. Presumably this is due to a combination of patient age and physiologic dilatation related to prior cholecystectomy. No obstructing lesion is identified. Please note that sensitivity and negative predictive value are adversely affected by the severe degree of motion artifact on today's exam. 2. Superior endplate compression fracture at L2 was not present on  03/12/2017, and may be subacute. 3. Other imaging findings of potential clinical significance: Aortic Atherosclerosis (ICD10-I70.0). Infrarenal IVC filter. Sigmoid colon diverticulosis. Dextroconvex lumbar scoliosis. Left renal parapelvic cysts. Right lower lobe scarring or atelectasis. Electronically Signed   By: Van Clines M.D.   On: 04/21/2018 10:21    Microbiology: Recent Results (from the past 240 hour(s))  Urine culture     Status: None   Collection Time: 04/20/18  1:26 PM  Result Value Ref Range Status   Specimen Description   Final    URINE, CLEAN CATCH Performed at [facility], [address], [place], [place] [address]    Special Requests   Final    NONE Performed at [facility], [address] [place], [place] [address]    Culture   Final    NO GROWTH Performed at [facility], [place] [address], [place], [place] [address]    Report Status 04/21/2018 FINAL  Final     Labs: Basic Metabolic Panel: Recent Labs  Lab 04/20/18 1041 04/21/18 0446  NA 138 141  K 3.3* 3.1*  CL 90* 100  CO2 34* 31  GLUCOSE 100* 99  BUN 36* 27*  CREATININE 1.44* 1.18  CALCIUM 9.4 8.8*   Liver Function Tests: Recent Labs  Lab 04/20/18 1041 04/21/18 0446  AST 29 23  ALT 8 13  ALKPHOS 95 78  BILITOT 2.4* 2.1*  PROT 7.8 6.6  ALBUMIN 4.4 3.6    Recent Labs  Lab 04/20/18 1612  AMMONIA 10   CBC: Recent Labs  Lab 04/20/18 1041  WBC 8.7  NEUTROABS 7.1  HGB 13.2  HCT 40.6  MCV 88.5  PLT 174   Cardiac Enzymes: Recent Labs  Lab 04/20/18 1230  TROPONINI <0.03    Signed:  Barton Dubois MD.  [facility] 04/21/2018, 11:35 AM

## 2018-04-22 DIAGNOSIS — J150 Pneumonia due to Klebsiella pneumoniae: Secondary | ICD-10-CM | POA: Diagnosis not present

## 2018-04-22 DIAGNOSIS — E876 Hypokalemia: Secondary | ICD-10-CM | POA: Diagnosis not present

## 2018-04-22 DIAGNOSIS — M792 Neuralgia and neuritis, unspecified: Secondary | ICD-10-CM | POA: Diagnosis not present

## 2018-04-22 DIAGNOSIS — R4182 Altered mental status, unspecified: Secondary | ICD-10-CM | POA: Diagnosis not present

## 2018-04-25 DIAGNOSIS — J150 Pneumonia due to Klebsiella pneumoniae: Secondary | ICD-10-CM | POA: Diagnosis not present

## 2018-04-28 DIAGNOSIS — M109 Gout, unspecified: Secondary | ICD-10-CM | POA: Diagnosis not present

## 2018-04-28 DIAGNOSIS — M792 Neuralgia and neuritis, unspecified: Secondary | ICD-10-CM | POA: Diagnosis not present

## 2018-04-28 DIAGNOSIS — M79671 Pain in right foot: Secondary | ICD-10-CM | POA: Diagnosis not present

## 2018-04-28 DIAGNOSIS — J150 Pneumonia due to Klebsiella pneumoniae: Secondary | ICD-10-CM | POA: Diagnosis not present

## 2018-04-28 DIAGNOSIS — M25552 Pain in left hip: Secondary | ICD-10-CM | POA: Diagnosis not present

## 2018-04-29 DIAGNOSIS — J150 Pneumonia due to Klebsiella pneumoniae: Secondary | ICD-10-CM | POA: Diagnosis not present

## 2018-04-30 DIAGNOSIS — J150 Pneumonia due to Klebsiella pneumoniae: Secondary | ICD-10-CM | POA: Diagnosis not present

## 2018-05-04 DIAGNOSIS — J150 Pneumonia due to Klebsiella pneumoniae: Secondary | ICD-10-CM | POA: Diagnosis not present

## 2018-05-05 DIAGNOSIS — J150 Pneumonia due to Klebsiella pneumoniae: Secondary | ICD-10-CM | POA: Diagnosis not present

## 2018-05-06 DIAGNOSIS — J150 Pneumonia due to Klebsiella pneumoniae: Secondary | ICD-10-CM | POA: Diagnosis not present

## 2018-05-07 DIAGNOSIS — J150 Pneumonia due to Klebsiella pneumoniae: Secondary | ICD-10-CM | POA: Diagnosis not present

## 2018-05-10 DIAGNOSIS — R4182 Altered mental status, unspecified: Secondary | ICD-10-CM | POA: Diagnosis not present

## 2018-05-10 DIAGNOSIS — E876 Hypokalemia: Secondary | ICD-10-CM | POA: Diagnosis not present

## 2018-05-10 DIAGNOSIS — R531 Weakness: Secondary | ICD-10-CM | POA: Diagnosis not present

## 2018-05-11 DIAGNOSIS — J150 Pneumonia due to Klebsiella pneumoniae: Secondary | ICD-10-CM | POA: Diagnosis not present

## 2018-05-14 DIAGNOSIS — J150 Pneumonia due to Klebsiella pneumoniae: Secondary | ICD-10-CM | POA: Diagnosis not present

## 2018-05-18 DIAGNOSIS — J150 Pneumonia due to Klebsiella pneumoniae: Secondary | ICD-10-CM | POA: Diagnosis not present

## 2018-05-20 DIAGNOSIS — J150 Pneumonia due to Klebsiella pneumoniae: Secondary | ICD-10-CM | POA: Diagnosis not present

## 2018-05-21 DIAGNOSIS — J150 Pneumonia due to Klebsiella pneumoniae: Secondary | ICD-10-CM | POA: Diagnosis not present

## 2018-07-01 NOTE — Progress Notes (Deleted)
Julian Ramirez was seen today in the movement disorders clinic for neurologic consultation at the request of Caryl Bis, MD.  The consultation is for the evaluation of Parkinsons and syncope.  Records sent from the facility are reviewed, but there are no records regarding Parkinson's disease or syncope. Care everywhere was queried but no notes from surrounding health systems.  Patient was seen by Dr. Merlene Laughter in 2015 while admitted to the hospital.  2015 notes from Dr. Merlene Laughter indicate that the patient was diagnosed with Parkinson's disease in the mid 2014. He reports that he was seen at the [facility] in [place] previously for PD.   It appears that he started on amantadine first, but it caused dry mouth.  It appears that the patient was started on levodopa during that hospitalization.  SNF records indicate he is on carbidopa/levodopa 50/200 tid.  He doesn't know what time it is given, although does know what time he gets meds in the day but not sure when specifically carbidopa/levodopa is given.  Doesn't think that was always on the 50/200 and doesn't know when changed.   07/04/18 update: Patient is seen today in follow-up for Parkinson's disease.  Last visit, I changed him from carbidopa/levodopa 50/203 times per day to carbidopa/levodopa 25/100 immediate release, 2 tablets 3 times per day.  No hallucinations.  No lightheadedness or near syncope.  I have reviewed records since last visit.  He was in the hospital at the end of October for generalized weakness and deconditioning, that occurred after a bout of pneumonia.  PREVIOUS MEDICATIONS: Sinemet  ALLERGIES:   Allergies  Allergen Reactions  . Penicillins Shortness Of Breath and Swelling    Mouth swells   . Peanuts [Peanut Oil] Swelling    Causes swelling in the lower body areas.   . Wheat Bran Swelling    Wheat four causes swelling in the lower body areas     CURRENT MEDICATIONS:  Outpatient Encounter Medications as of  07/04/2018  Medication Sig  . Amino Acids-Protein Hydrolys (FEEDING SUPPLEMENT, PRO-STAT SUGAR FREE 64,) LIQD Take 30 mLs by mouth daily.  . Ascorbic Acid (VITAMIN C) 100 MG tablet Take 100 mg by mouth daily.  . Baclofen 5 MG TABS Take 5 mg by mouth 2 (two) times daily.  . carbidopa-levodopa (SINEMET IR) 25-100 MG tablet Take 2 tablets by mouth 3 (three) times daily. 7 am. 11 am. 4 pm.  . DULoxetine (CYMBALTA) 30 MG capsule Take 30 mg by mouth daily.  . finasteride (PROSCAR) 5 MG tablet Take 5 mg by mouth daily.  . furosemide (LASIX) 20 MG tablet Take by mouth. 3 in the morning, 2 in the evening  . Menthol (ICY HOT) 5 % PTCH Apply topically.  . polyethylene glycol (MIRALAX / GLYCOLAX) packet Take 17 g by mouth daily.  . potassium chloride SA (K-DUR,KLOR-CON) 20 MEQ tablet Take 20 mEq by mouth daily.  . pregabalin (LYRICA) 100 MG capsule Take 100 mg by mouth 3 (three) times daily.  . tamsulosin (FLOMAX) 0.4 MG CAPS capsule Take 0.4 mg by mouth at bedtime.  . traZODone (DESYREL) 50 MG tablet Take 50 mg by mouth at bedtime.   No facility-administered encounter medications on file as of 07/04/2018.     PAST MEDICAL HISTORY:   Past Medical History:  Diagnosis Date  . Chronic pain   . Edema   . Hypertension   . Neuropathy   . Parkinson disease ([place])     PAST SURGICAL HISTORY:  Past Surgical History:  Procedure Laterality Date  . BACK SURGERY    . BRAIN SURGERY    . CHOLECYSTECTOMY  2017   [facility]  . CORONARY ANGIOPLASTY WITH STENT PLACEMENT      SOCIAL HISTORY:   Social History   Socioeconomic History  . Marital status: Divorced    Spouse name: Not on file  . Number of children: Not on file  . Years of education: Not on file  . Highest education level: Not on file  Occupational History  . Not on file  Social Needs  . Financial resource strain: Not on file  . Food insecurity:    Worry: Not on file    Inability: Not on file  . Transportation needs:    Medical: Not on file     Non-medical: Not on file  Tobacco Use  . Smoking status: Never Smoker  . Smokeless tobacco: Never Used  Substance and Sexual Activity  . Alcohol use: No  . Drug use: No  . Sexual activity: Not on file  Lifestyle  . Physical activity:    Days per week: Not on file    Minutes per session: Not on file  . Stress: Not on file  Relationships  . Social connections:    Talks on phone: Not on file    Gets together: Not on file    Attends religious service: Not on file    Active member of club or organization: Not on file    Attends meetings of clubs or organizations: Not on file    Relationship status: Not on file  . Intimate partner violence:    Fear of current or ex partner: Not on file    Emotionally abused: Not on file    Physically abused: Not on file    Forced sexual activity: Not on file  Other Topics Concern  . Not on file  Social History Narrative  . Not on file    FAMILY HISTORY:   Family Status  Relation Name Status  . Mother  Deceased  . Father  Deceased  . Sister x2 Deceased  . Brother  Deceased    ROS:  ROS  PHYSICAL EXAMINATION:    VITALS:   There were no vitals filed for this visit.  GEN:  The patient appears stated age and is in NAD. HEENT:  Normocephalic, atraumatic.  The mucous membranes are moist. The superficial temporal arteries are without ropiness or tenderness. CV:  RRR Lungs:  CTAB Neck/HEME:  There are no carotid bruits bilaterally.  Neurological examination:  Orientation: The patient is alert and oriented x3. Fund of knowledge is appropriate.  Recent and remote memory are intact.  Attention and concentration are normal.    Able to name objects and repeat phrases. Cranial nerves: There is good facial symmetry. Pupils are equal round and reactive to light bilaterally. Fundoscopic exam reveals clear margins bilaterally. Extraocular muscles are intact. The visual fields are full to confrontational testing. The speech is fluent and clear.  Soft palate rises symmetrically and there is no tongue deviation. Hearing is markedly decreased to conversational tone. Sensation: Sensation is intact to light and pinprick throughout (facial, trunk, extremities). Vibration is decreased distally. There is no extinction with double simultaneous stimulation. There is no sensory dermatomal level identified. Motor: Strength is 5/5 in the bilateral upper and lower extremities.   Shoulder shrug is equal and symmetric.  There is no pronator drift. Deep tendon reflexes: Deep tendon reflexes are 2-/4 at the bilateral biceps,  triceps, brachioradialis, patella and achilles. Plantar responses are downgoing bilaterally.  Movement examination: Tone: There is mod increased tone in the LUE/LLE.  Tone normal on the right. Abnormal movements: none Coordination:  There is mild decremation with RAM's, mostly with finger taps and hand opening/closing bilaterally.  Other RAMs were normal Gait and Station: The patient has  difficulty arising out of a deep-seated chair without the use of the hands.  He pushes off of the wheelchair and requires minimal assistance from the examiner.  He is given a walker.  He has a stooped posture.  He has a short gait, but shuffles just a little.    ASSESSMENT/PLAN:  1.  idiopathic Parkinson's disease.    -We discussed the diagnosis as well as pathophysiology of the disease.  We discussed treatment options as well as prognostic indicators.  Patient education was provided.  -We discussed that it used to be thought that levodopa would increase risk of melanoma but now it is believed that Parkinsons itself likely increases risk of melanoma. he is to get regular skin checks.  -We decided to change carbidopa/levodopa 50/200 from 1 tablet 3 times per day to carbidopa/levodopa 25/100, 2 tablets at 7am/11am/4pm.  He will let me know should he have any problems.  This is a dose increase by about 30% given poorer bioavailability of the CR.  -We  discussed community resources in the area including patient support groups and community exercise programs for PD and pt education was provided to the patient.  He is clearly frustrated by the fact that he cannot exercise at the nursing facility without physical therapy and it sounds like Medicare physical therapy has run out for him.  Not sure that we can really change this on our end, but I did have him meet with our Education officer, museum.  2.  Chronic back and hip pain  -Explained to the patient that I really do not treat this.  It does limit his ability to walk.  3.  Lower extremity edema  -On my examination, there was no pitting edema.  However, I do think he has an upcoming appointment with cardiology (patient was unaware but saw it on referral).  4.  Syncope  -Patient was referred for syncope, but he denies this.  No notes accompany him to suggest that there was a syncopal event.  Patient was alert and oriented x3 today.  5.  ***  Cc:  Caryl Bis, MD

## 2018-07-04 ENCOUNTER — Ambulatory Visit: Payer: Medicare HMO | Admitting: Neurology

## 2018-07-06 NOTE — Progress Notes (Signed)
Julian Ramirez was seen today in the movement disorders clinic for neurologic consultation at the request of Caryl Bis, MD.  The consultation is for the evaluation of Parkinsons and syncope.  Records sent from the facility are reviewed, but there are no records regarding Parkinson's disease or syncope. Care everywhere was queried but no notes from surrounding health systems.  Patient was seen by Dr. Merlene Laughter in 2015 while admitted to the hospital.  2015 notes from Dr. Merlene Laughter indicate that the patient was diagnosed with Parkinson's disease in the mid 2014. He reports that he was seen at the [facility] in [place] previously for PD.   It appears that he started on amantadine first, but it caused dry mouth.  It appears that the patient was started on levodopa during that hospitalization.  SNF records indicate he is on carbidopa/levodopa 50/200 tid.  He doesn't know what time it is given, although does know what time he gets meds in the day but not sure when specifically carbidopa/levodopa is given.  Doesn't think that was always on the 50/200 and doesn't know when changed.   07/14/18 update: Patient is seen today in follow-up for Parkinson's disease.  He is accompanied by a nursing home CNA, who supplements the history.  Last visit, I changed him from carbidopa/levodopa 50/200 3 times per day to carbidopa/levodopa 25/100 immediate release, 2 tablets 3 times per day.  The patient does think that this was beneficial, although cannot really tell me details.  No hallucinations.  Some lightheadedness but no near syncope.  I have reviewed records since last visit.  He was in the hospital at the end of October for generalized weakness and deconditioning, that occurred after a bout of pneumonia.  PREVIOUS MEDICATIONS: Sinemet  ALLERGIES:   Allergies  Allergen Reactions  . Penicillins Shortness Of Breath and Swelling    Mouth swells   . Peanuts [Peanut Oil] Swelling    Causes swelling in the  lower body areas.   . Wheat Bran Swelling    Wheat four causes swelling in the lower body areas     CURRENT MEDICATIONS:  Outpatient Encounter Medications as of 07/14/2018  Medication Sig  . acetaminophen (TYLENOL) 325 MG tablet Take 650 mg by mouth every 6 (six) hours as needed.  Marland Kitchen antiseptic oral rinse (BIOTENE) LIQD 15 mLs by Mouth Rinse route as needed for dry mouth.  . Baclofen 5 MG TABS Take 5 mg by mouth 2 (two) times daily.  . carbidopa-levodopa (SINEMET IR) 25-100 MG tablet Take 2 tablets by mouth 3 (three) times daily. 7 am. 11 am. 4 pm.  . cefTRIAXone (ROCEPHIN) IVPB Inject into the vein.  Marland Kitchen dexamethasone (DECADRON) 4 MG tablet Take 4 mg by mouth daily.  . DULoxetine (CYMBALTA) 30 MG capsule Take 30 mg by mouth daily.  . finasteride (PROSCAR) 5 MG tablet Take 5 mg by mouth daily.  . furosemide (LASIX) 20 MG tablet Take by mouth. 80 in the morning, 60 in the afternoon  . guaifenesin (ROBITUSSIN) 100 MG/5ML syrup Take 200 mg by mouth 3 (three) times daily as needed for cough.  . Menthol (ICY HOT) 5 % PTCH Apply topically.  . Menthol-Zinc Oxide (CALMOSEPTINE) 0.44-20.6 % OINT Apply topically.  . Multiple Vitamin (MULTIVITAMIN) tablet Take 1 tablet by mouth daily.  Marland Kitchen oxyCODONE-acetaminophen (PERCOCET) 10-325 MG tablet Take 1 tablet by mouth 3 (three) times daily.  . polyethylene glycol (MIRALAX / GLYCOLAX) packet Take 17 g by mouth daily.  Marland Kitchen  potassium chloride SA (K-DUR,KLOR-CON) 20 MEQ tablet Take 20 mEq by mouth daily.  . pregabalin (LYRICA) 100 MG capsule Take 100 mg by mouth 3 (three) times daily.  . tamsulosin (FLOMAX) 0.4 MG CAPS capsule Take 0.4 mg by mouth at bedtime.  . traZODone (DESYREL) 50 MG tablet Take 50 mg by mouth at bedtime.  . [DISCONTINUED] Amino Acids-Protein Hydrolys (FEEDING SUPPLEMENT, PRO-STAT SUGAR FREE 64,) LIQD Take 30 mLs by mouth daily.  . [DISCONTINUED] Ascorbic Acid (VITAMIN C) 100 MG tablet Take 100 mg by mouth daily.   No facility-administered  encounter medications on file as of 07/14/2018.     PAST MEDICAL HISTORY:   Past Medical History:  Diagnosis Date  . Chronic pain   . Edema   . Hypertension   . Neuropathy   . Parkinson disease ([place])     PAST SURGICAL HISTORY:   Past Surgical History:  Procedure Laterality Date  . BACK SURGERY    . BRAIN SURGERY    . CHOLECYSTECTOMY  2017   [facility]  . CORONARY ANGIOPLASTY WITH STENT PLACEMENT      SOCIAL HISTORY:   Social History   Socioeconomic History  . Marital status: Divorced    Spouse name: Not on file  . Number of children: Not on file  . Years of education: Not on file  . Highest education level: Not on file  Occupational History  . Not on file  Social Needs  . Financial resource strain: Not on file  . Food insecurity:    Worry: Not on file    Inability: Not on file  . Transportation needs:    Medical: Not on file    Non-medical: Not on file  Tobacco Use  . Smoking status: Never Smoker  . Smokeless tobacco: Never Used  Substance and Sexual Activity  . Alcohol use: No  . Drug use: No  . Sexual activity: Not on file  Lifestyle  . Physical activity:    Days per week: Not on file    Minutes per session: Not on file  . Stress: Not on file  Relationships  . Social connections:    Talks on phone: Not on file    Gets together: Not on file    Attends religious service: Not on file    Active member of club or organization: Not on file    Attends meetings of clubs or organizations: Not on file    Relationship status: Not on file  . Intimate partner violence:    Fear of current or ex partner: Not on file    Emotionally abused: Not on file    Physically abused: Not on file    Forced sexual activity: Not on file  Other Topics Concern  . Not on file  Social History Narrative  . Not on file    FAMILY HISTORY:   Family Status  Relation Name Status  . Mother  Deceased  . Father  Deceased  . Sister x2 Deceased  . Brother  Deceased    ROS:  Review of  Systems  Constitutional: Negative.   HENT: Negative.   Eyes: Negative.   Respiratory: Negative.   Cardiovascular: Negative.   Skin: Negative.   Neurological: Positive for tingling (feet).    PHYSICAL EXAMINATION:    VITALS:   Vitals:   07/14/18 1237  BP: 108/68  Pulse: 64    GEN:  The patient appears stated age and is in NAD. HEENT:  Normocephalic, atraumatic.  The mucous membranes are  moist. The superficial temporal arteries are without ropiness or tenderness. CV:  RRR Lungs:  CTAB Neck/HEME:  There are no carotid bruits bilaterally. Skin:  Significant edema in both feet  Neurological examination:  Orientation: The patient is alert and oriented x3. Cranial nerves: There is good facial symmetry. The speech is fluent and clear. Soft palate rises symmetrically and there is no tongue deviation. Hearing is markedly decreased to conversational tone. Sensation: Sensation is intact to light touch throughout Motor: Strength is 5/5 in the bilateral upper and lower extremities.   Shoulder shrug is equal and symmetric.  There is no pronator drift.   Movement examination: Tone: There is normal tone in the UE/LE Abnormal movements: none Coordination:  There is slowness with heel taps on the L (likely due to due LE edema).  All other RAMs are without decremation,  including alternating supination and pronation of the forearm, hand opening and closing, finger taps, toe taps bilaterally Gait and Station: The patient states that he no longer ambulates so we opted not to walk him  ASSESSMENT/PLAN:  1.  idiopathic Parkinson's disease.    -doing much better on carbidopa/levodopa 25/100, 2 po tid.  Risks, benefits, side effects and alternative therapies were discussed.  The opportunity to ask questions was given and they were answered to the best of my ability.  The patient expressed understanding and willingness to follow the outlined treatment protocols.  -PT  2.  Lower extremity  edema  -is a hinderence to patient and he c/o this.    3.  F/u 6-8 months  Cc:  Caryl Bis, MD

## 2018-07-14 ENCOUNTER — Ambulatory Visit (INDEPENDENT_AMBULATORY_CARE_PROVIDER_SITE_OTHER): Payer: Medicare Other | Admitting: Neurology

## 2018-07-14 ENCOUNTER — Encounter: Payer: Self-pay | Admitting: Neurology

## 2018-07-14 VITALS — BP 108/68 | HR 64

## 2018-07-14 DIAGNOSIS — G20 Parkinson's disease: Secondary | ICD-10-CM

## 2018-07-30 ENCOUNTER — Observation Stay (HOSPITAL_COMMUNITY)
Admission: EM | Admit: 2018-07-30 | Discharge: 2018-08-01 | Disposition: A | Discharge status: Skilled Nursing Facility | Payer: Medicare Other | Attending: Family Medicine | Admitting: Family Medicine

## 2018-07-30 ENCOUNTER — Emergency Department (HOSPITAL_COMMUNITY): Payer: Medicare Other

## 2018-07-30 ENCOUNTER — Encounter (HOSPITAL_COMMUNITY): Payer: Self-pay | Admitting: Emergency Medicine

## 2018-07-30 ENCOUNTER — Other Ambulatory Visit: Payer: Self-pay

## 2018-07-30 DIAGNOSIS — G20 Parkinson's disease: Secondary | ICD-10-CM

## 2018-07-30 DIAGNOSIS — Z955 Presence of coronary angioplasty implant and graft: Secondary | ICD-10-CM | POA: Insufficient documentation

## 2018-07-30 DIAGNOSIS — I272 Pulmonary hypertension, unspecified: Secondary | ICD-10-CM | POA: Diagnosis not present

## 2018-07-30 DIAGNOSIS — I82413 Acute embolism and thrombosis of femoral vein, bilateral: Secondary | ICD-10-CM | POA: Diagnosis not present

## 2018-07-30 DIAGNOSIS — G894 Chronic pain syndrome: Secondary | ICD-10-CM | POA: Diagnosis not present

## 2018-07-30 DIAGNOSIS — I959 Hypotension, unspecified: Secondary | ICD-10-CM | POA: Diagnosis not present

## 2018-07-30 DIAGNOSIS — G20A1 Parkinson's disease without dyskinesia, without mention of fluctuations: Secondary | ICD-10-CM | POA: Diagnosis present

## 2018-07-30 DIAGNOSIS — Z880 Allergy status to penicillin: Secondary | ICD-10-CM | POA: Diagnosis not present

## 2018-07-30 DIAGNOSIS — Z79891 Long term (current) use of opiate analgesic: Secondary | ICD-10-CM | POA: Insufficient documentation

## 2018-07-30 DIAGNOSIS — N179 Acute kidney failure, unspecified: Secondary | ICD-10-CM | POA: Diagnosis present

## 2018-07-30 DIAGNOSIS — I10 Essential (primary) hypertension: Secondary | ICD-10-CM | POA: Diagnosis present

## 2018-07-30 DIAGNOSIS — Z66 Do not resuscitate: Secondary | ICD-10-CM | POA: Diagnosis not present

## 2018-07-30 DIAGNOSIS — G8929 Other chronic pain: Secondary | ICD-10-CM | POA: Diagnosis present

## 2018-07-30 DIAGNOSIS — E876 Hypokalemia: Secondary | ICD-10-CM | POA: Diagnosis present

## 2018-07-30 DIAGNOSIS — I77810 Thoracic aortic ectasia: Secondary | ICD-10-CM | POA: Insufficient documentation

## 2018-07-30 DIAGNOSIS — N189 Chronic kidney disease, unspecified: Secondary | ICD-10-CM | POA: Insufficient documentation

## 2018-07-30 DIAGNOSIS — R7989 Other specified abnormal findings of blood chemistry: Secondary | ICD-10-CM | POA: Diagnosis present

## 2018-07-30 DIAGNOSIS — I82431 Acute embolism and thrombosis of right popliteal vein: Secondary | ICD-10-CM | POA: Diagnosis not present

## 2018-07-30 DIAGNOSIS — G629 Polyneuropathy, unspecified: Secondary | ICD-10-CM | POA: Diagnosis not present

## 2018-07-30 DIAGNOSIS — E860 Dehydration: Secondary | ICD-10-CM | POA: Diagnosis not present

## 2018-07-30 DIAGNOSIS — L8990 Pressure ulcer of unspecified site, unspecified stage: Secondary | ICD-10-CM

## 2018-07-30 DIAGNOSIS — Z79899 Other long term (current) drug therapy: Secondary | ICD-10-CM | POA: Diagnosis not present

## 2018-07-30 DIAGNOSIS — R600 Localized edema: Secondary | ICD-10-CM | POA: Diagnosis not present

## 2018-07-30 DIAGNOSIS — I129 Hypertensive chronic kidney disease with stage 1 through stage 4 chronic kidney disease, or unspecified chronic kidney disease: Secondary | ICD-10-CM | POA: Diagnosis not present

## 2018-07-30 DIAGNOSIS — Z8249 Family history of ischemic heart disease and other diseases of the circulatory system: Secondary | ICD-10-CM | POA: Diagnosis not present

## 2018-07-30 DIAGNOSIS — I082 Rheumatic disorders of both aortic and tricuspid valves: Secondary | ICD-10-CM | POA: Insufficient documentation

## 2018-07-30 DIAGNOSIS — R9431 Abnormal electrocardiogram [ECG] [EKG]: Secondary | ICD-10-CM | POA: Insufficient documentation

## 2018-07-30 DIAGNOSIS — R531 Weakness: Secondary | ICD-10-CM | POA: Diagnosis not present

## 2018-07-30 DIAGNOSIS — R778 Other specified abnormalities of plasma proteins: Secondary | ICD-10-CM | POA: Diagnosis present

## 2018-07-30 LAB — CBC WITH DIFFERENTIAL/PLATELET
Abs Immature Granulocytes: 0.16 10*3/uL — ABNORMAL HIGH (ref 0.00–0.07)
Basophils Absolute: 0 10*3/uL (ref 0.0–0.1)
Basophils Relative: 0 %
EOS ABS: 0 10*3/uL (ref 0.0–0.5)
Eosinophils Relative: 1 %
HCT: 36.8 % — ABNORMAL LOW (ref 39.0–52.0)
Hemoglobin: 12.7 g/dL — ABNORMAL LOW (ref 13.0–17.0)
Immature Granulocytes: 2 %
Lymphocytes Relative: 12 %
Lymphs Abs: 1.1 10*3/uL (ref 0.7–4.0)
MCH: 31 pg (ref 26.0–34.0)
MCHC: 34.5 g/dL (ref 30.0–36.0)
MCV: 89.8 fL (ref 80.0–100.0)
MONO ABS: 0.5 10*3/uL (ref 0.1–1.0)
MONOS PCT: 5 %
Neutro Abs: 7.1 10*3/uL (ref 1.7–7.7)
Neutrophils Relative %: 80 %
PLATELETS: 214 10*3/uL (ref 150–400)
RBC: 4.1 MIL/uL — ABNORMAL LOW (ref 4.22–5.81)
RDW: 14.6 % (ref 11.5–15.5)
WBC: 8.8 10*3/uL (ref 4.0–10.5)
nRBC: 0 % (ref 0.0–0.2)

## 2018-07-30 LAB — URINALYSIS, ROUTINE W REFLEX MICROSCOPIC
BILIRUBIN URINE: NEGATIVE
Glucose, UA: NEGATIVE mg/dL
Hgb urine dipstick: NEGATIVE
Ketones, ur: NEGATIVE mg/dL
Leukocytes, UA: NEGATIVE
NITRITE: NEGATIVE
PROTEIN: NEGATIVE mg/dL
pH: 7 (ref 5.0–8.0)

## 2018-07-30 LAB — COMPREHENSIVE METABOLIC PANEL
ALT: 24 U/L (ref 0–44)
AST: 20 U/L (ref 15–41)
Albumin: 3.9 g/dL (ref 3.5–5.0)
Alkaline Phosphatase: 85 U/L (ref 38–126)
Anion gap: 15 (ref 5–15)
BUN: 51 mg/dL — ABNORMAL HIGH (ref 8–23)
CO2: 36 mmol/L — ABNORMAL HIGH (ref 22–32)
CREATININE: 1.46 mg/dL — AB (ref 0.61–1.24)
Calcium: 9.2 mg/dL (ref 8.9–10.3)
Chloride: 84 mmol/L — ABNORMAL LOW (ref 98–111)
GFR, EST AFRICAN AMERICAN: 49 mL/min — AB (ref >60)
GFR, EST NON AFRICAN AMERICAN: 42 mL/min — AB (ref >60)
Glucose, Bld: 115 mg/dL — ABNORMAL HIGH (ref 70–99)
POTASSIUM: 2.5 mmol/L — AB (ref 3.5–5.1)
Sodium: 135 mmol/L (ref 135–145)
Total Bilirubin: 1 mg/dL (ref 0.3–1.2)
Total Protein: 6.8 g/dL (ref 6.5–8.1)

## 2018-07-30 LAB — TROPONIN I
TROPONIN I: 0.08 ng/mL — AB (ref <0.03)
TROPONIN I: 0.07 ng/mL — AB (ref <0.03)

## 2018-07-30 LAB — MAGNESIUM: Magnesium: 2.2 mg/dL (ref 1.7–2.4)

## 2018-07-30 LAB — BRAIN NATRIURETIC PEPTIDE: B NATRIURETIC PEPTIDE 5: 33 pg/mL (ref 0.0–100.0)

## 2018-07-30 MED ORDER — DEXAMETHASONE 4 MG PO TABS
4.0000 mg | ORAL_TABLET | Freq: Every day | ORAL | Status: DC
  Administered 2018-07-30 – 2018-08-01 (×3): 4 mg via ORAL
  Filled 2018-07-30 (×3): qty 1

## 2018-07-30 MED ORDER — PRO-STAT SUGAR FREE PO LIQD
30.0000 mL | Freq: Every day | ORAL | Status: DC
  Administered 2018-07-31: 30 mL via ORAL
  Filled 2018-07-30 (×2): qty 30

## 2018-07-30 MED ORDER — FINASTERIDE 5 MG PO TABS
5.0000 mg | ORAL_TABLET | Freq: Every day | ORAL | Status: DC
  Administered 2018-07-31 – 2018-08-01 (×2): 5 mg via ORAL
  Filled 2018-07-30 (×2): qty 1

## 2018-07-30 MED ORDER — FERROUS SULFATE 325 (65 FE) MG PO TABS
325.0000 mg | ORAL_TABLET | Freq: Every day | ORAL | Status: DC
  Administered 2018-07-31 – 2018-08-01 (×2): 325 mg via ORAL
  Filled 2018-07-30 (×2): qty 1

## 2018-07-30 MED ORDER — ONDANSETRON HCL 4 MG PO TABS: 4.0000 mg | ORAL_TABLET | Freq: Four times a day (QID) | ORAL | Status: DC | PRN

## 2018-07-30 MED ORDER — POTASSIUM CHLORIDE IN NACL 20-0.9 MEQ/L-% IV SOLN
INTRAVENOUS | Status: DC
  Administered 2018-07-30 – 2018-08-01 (×2): via INTRAVENOUS

## 2018-07-30 MED ORDER — ACETAMINOPHEN 325 MG PO TABS: 650.0000 mg | ORAL_TABLET | Freq: Four times a day (QID) | ORAL | Status: DC | PRN

## 2018-07-30 MED ORDER — ADULT MULTIVITAMIN W/MINERALS CH
1.0000 | ORAL_TABLET | Freq: Every day | ORAL | Status: DC
  Administered 2018-07-30 – 2018-08-01 (×3): 1 via ORAL
  Filled 2018-07-30 (×6): qty 1

## 2018-07-30 MED ORDER — TRAZODONE HCL 50 MG PO TABS
50.0000 mg | ORAL_TABLET | Freq: Every day | ORAL | Status: DC
  Administered 2018-07-30 – 2018-07-31 (×2): 50 mg via ORAL
  Filled 2018-07-30 (×2): qty 1

## 2018-07-30 MED ORDER — PREGABALIN 75 MG PO CAPS
75.0000 mg | ORAL_CAPSULE | Freq: Three times a day (TID) | ORAL | Status: DC
  Administered 2018-07-30 – 2018-08-01 (×7): 75 mg via ORAL
  Filled 2018-07-30 (×7): qty 1

## 2018-07-30 MED ORDER — DOCUSATE SODIUM 100 MG PO CAPS
100.0000 mg | ORAL_CAPSULE | Freq: Every day | ORAL | Status: DC
  Administered 2018-07-30 – 2018-08-01 (×3): 100 mg via ORAL
  Filled 2018-07-30 (×3): qty 1

## 2018-07-30 MED ORDER — OXYCODONE HCL 5 MG PO TABS
5.0000 mg | ORAL_TABLET | Freq: Three times a day (TID) | ORAL | Status: DC
  Administered 2018-07-30 – 2018-08-01 (×5): 5 mg via ORAL
  Filled 2018-07-30 (×5): qty 1

## 2018-07-30 MED ORDER — DULOXETINE HCL 30 MG PO CPEP
30.0000 mg | ORAL_CAPSULE | Freq: Every day | ORAL | Status: DC
  Administered 2018-07-30 – 2018-08-01 (×3): 30 mg via ORAL
  Filled 2018-07-30 (×3): qty 1

## 2018-07-30 MED ORDER — BIOTENE DRY MOUTH MT LIQD: 15.0000 mL | OROMUCOSAL | Status: DC | PRN

## 2018-07-30 MED ORDER — POTASSIUM CHLORIDE 10 MEQ/100ML IV SOLN
10.0000 meq | INTRAVENOUS | Status: AC
  Administered 2018-07-30 (×2): 10 meq via INTRAVENOUS
  Filled 2018-07-30 (×2): qty 100

## 2018-07-30 MED ORDER — POLYETHYLENE GLYCOL 3350 17 G PO PACK
17.0000 g | PACK | Freq: Every day | ORAL | Status: DC
  Administered 2018-07-31: 17 g via ORAL
  Filled 2018-07-30 (×3): qty 1

## 2018-07-30 MED ORDER — SODIUM CHLORIDE 0.9 % IV BOLUS
1000.0000 mL | Freq: Once | INTRAVENOUS | Status: AC
  Administered 2018-07-30: 1000 mL via INTRAVENOUS

## 2018-07-30 MED ORDER — POTASSIUM CHLORIDE CRYS ER 20 MEQ PO TBCR
20.0000 meq | EXTENDED_RELEASE_TABLET | Freq: Every day | ORAL | Status: DC
  Administered 2018-07-30 – 2018-07-31 (×2): 20 meq via ORAL
  Filled 2018-07-30 (×3): qty 1

## 2018-07-30 MED ORDER — BACLOFEN 10 MG PO TABS
5.0000 mg | ORAL_TABLET | Freq: Two times a day (BID) | ORAL | Status: DC
  Administered 2018-07-31 – 2018-08-01 (×3): 5 mg via ORAL
  Filled 2018-07-30 (×5): qty 1

## 2018-07-30 MED ORDER — HEPARIN SODIUM (PORCINE) 5000 UNIT/ML IJ SOLN
5000.0000 [IU] | Freq: Three times a day (TID) | INTRAMUSCULAR | Status: DC
  Administered 2018-07-30 – 2018-07-31 (×2): 5000 [IU] via SUBCUTANEOUS
  Filled 2018-07-30 (×2): qty 1

## 2018-07-30 MED ORDER — CARBIDOPA-LEVODOPA 25-100 MG PO TABS
2.0000 | ORAL_TABLET | Freq: Three times a day (TID) | ORAL | Status: DC
  Administered 2018-07-30 – 2018-08-01 (×7): 2 via ORAL
  Filled 2018-07-30 (×7): qty 2

## 2018-07-30 MED ORDER — TAMSULOSIN HCL 0.4 MG PO CAPS
0.4000 mg | ORAL_CAPSULE | Freq: Every day | ORAL | Status: DC
  Administered 2018-07-30 – 2018-07-31 (×2): 0.4 mg via ORAL
  Filled 2018-07-30 (×2): qty 1

## 2018-07-30 MED ORDER — GUAIFENESIN 100 MG/5ML PO SOLN
200.0000 mg | Freq: Three times a day (TID) | ORAL | Status: DC | PRN
  Filled 2018-07-30: qty 10

## 2018-07-30 MED ORDER — ONDANSETRON HCL 4 MG/2ML IJ SOLN: 4.0000 mg | Freq: Four times a day (QID) | INTRAMUSCULAR | Status: DC | PRN

## 2018-07-30 NOTE — Progress Notes (Signed)
Coban wraps to BLE applied at [facility] 07/29/2018, clean, dry, intact.  Foam dressings applied to left buttock and hip to multiple small skin tears,  that, per pt's son, are the result of falls at [facility].  Blanchable redness to sacrum, foam applied.

## 2018-07-30 NOTE — ED Notes (Signed)
CRITICAL VALUE ALERT  Critical Value:  Potassium 2.5     Troponin 0.08  Date & Time Notied:  07/30/2018 @ 1326  Provider Notified: Dr Roderic Palau  Orders Received/Actions taken: Orders to be given

## 2018-07-30 NOTE — H&P (Signed)
History and Physical  Julian Ramirez:[id number] DOB: 04/04/1927 DOA: 07/30/2018  Referring physician: Roderic Palau  PCP: Caryl Bis, MD   Chief Complaint: hypotension   HPI: Julian Ramirez is a 85 y.o. male from [place] for evaluation of hypotension starting last week.  NP at nursing home states the pt was taken off of all of his blood pressure medications last week for same.  Has been getting Lasix and is concerned about kidney function.  He has been taking lasix for increased edema in the legs which persists.  He denies having shortness of breath or chest pain at this time.  He does have Parkinson's disease and was recently seen by his neurologist for that.  It has been stable.  He has chronic pain and is on high-dose opioids.  He also has hypertension history but has been taken off his blood pressure medications last week due to low blood pressure readings.  According to nursing home records he is DNR.  ED Course: The patient was seen in the emergency department and noted to have a potassium of 2.5 and troponin of 0.08.  His vital signs were stable with a blood pressure of 107/51.  The patient was started on IV fluid hydration and IV potassium replacement.  He is being admitted for further evaluation and management.  Review of Systems: All systems reviewed and apart from history of presenting illness, are negative.  Past Medical History:  Diagnosis Date  . Chronic pain   . Edema   . Hypertension   . Neuropathy   . Parkinson disease [facility])    Past Surgical History:  Procedure Laterality Date  . BACK SURGERY    . BRAIN SURGERY    . CHOLECYSTECTOMY  2017   [facility]  . CORONARY ANGIOPLASTY WITH STENT PLACEMENT     Social History:  reports that he has never smoked. He has never used smokeless tobacco. He reports that he does not drink alcohol or use drugs.  Allergies  Allergen Reactions  . Penicillins Shortness Of Breath and Swelling    Mouth swells   . Peanuts [Peanut Oil]  Swelling    Causes swelling in the lower body areas.   . Wheat Bran Swelling    Wheat four causes swelling in the lower body areas     Family History  Problem Relation Age of Onset  . Heart attack Mother   . Heart attack Father   . Cancer Sister     Prior to Admission medications   Medication Sig Start Date End Date Taking? Authorizing Provider  acetaminophen (TYLENOL) 325 MG tablet Take 650 mg by mouth every 6 (six) hours as needed.   Yes [provider]  Amino Acids-Protein Hydrolys (FEEDING SUPPLEMENT, PRO-STAT SUGAR FREE 64,) LIQD Take 30 mLs by mouth daily.   Yes [provider]  antiseptic oral rinse (BIOTENE) LIQD 15 mLs by Mouth Rinse route as needed for dry mouth.   Yes [provider]  Baclofen 5 MG TABS Take 5 mg by mouth 2 (two) times daily. 01/31/18  Yes [provider]  carbidopa-levodopa (SINEMET IR) 25-100 MG tablet Take 2 tablets by mouth 3 (three) times daily. 7 am. 11 am. 4 pm. 02/17/18  Yes Tat, Julian Ramirez  dexamethasone (DECADRON) 4 MG tablet Take 4 mg by mouth daily.   Yes [provider]  docusate sodium (COLACE) 100 MG capsule Take 100 mg by mouth daily.   Yes [provider]  DULoxetine (CYMBALTA) 30  MG capsule Take 30 mg by mouth daily. 01/30/18  Yes [provider]  ferrous sulfate 325 (65 FE) MG tablet Take 325 mg by mouth 3 (three) times daily with meals.   Yes [provider]  finasteride (PROSCAR) 5 MG tablet Take 5 mg by mouth daily.   Yes [provider]  furosemide (LASIX) 20 MG tablet Take by mouth. 80 in the morning, 60 in the afternoon   Yes [provider]  guaifenesin (ROBITUSSIN) 100 MG/5ML syrup Take 200 mg by mouth 3 (three) times daily as needed for cough.   Yes [provider]  Menthol (ICY HOT) 5 % PTCH Apply topically.   Yes [provider]  metolazone (ZAROXOLYN) 2.5 MG tablet Take 2.5 mg by mouth daily. Prior to lasix   Yes [provider]  Multiple Vitamin (MULTIVITAMIN) tablet Take 1 tablet by mouth daily.   Yes [provider]  Oxycodone HCl 10 MG TABS Take 10 mg by mouth every 8 (eight) hours.   Yes [provider]  polyethylene glycol (MIRALAX / GLYCOLAX) packet Take 17 g by mouth daily.   Yes [provider]  potassium chloride SA (K-DUR,KLOR-CON) 20 MEQ tablet Take 20 mEq by mouth daily.   Yes [provider]  pregabalin (LYRICA) 100 MG capsule Take 100 mg by mouth 3 (three) times daily. 02/07/18  Yes [provider]  tamsulosin (FLOMAX) 0.4 MG CAPS capsule Take 0.4 mg by mouth at bedtime.   Yes [provider]  traZODone (DESYREL) 50 MG tablet Take 50 mg by mouth at bedtime.   Yes [provider]   Physical Exam: Vitals:   07/30/18 1107 07/30/18 1230  BP:  102/60  Pulse:  66  Resp:  12  SpO2:  97%  Weight: 84 kg   Height: 6' (1.829 m)     General exam: Moderately built and nourished patient, lying comfortably supine on the gurney in no obvious distress.  Head, eyes and ENT: Nontraumatic and normocephalic. Pupils equally reacting to light and accommodation. Oral mucosa dry.   Neck: Supple. No JVD, carotid bruit or thyromegaly.  Lymphatics: No lymphadenopathy.  Respiratory system: Clear to auscultation. No increased work of breathing.  Cardiovascular system: S1 and S2 heard, RRR. No JVD, murmurs, gallops, clicks. (+) pedal edema.  Gastrointestinal system: Abdomen is nondistended, soft and nontender. Normal bowel sounds heard. No organomegaly or masses appreciated.  Central nervous system: Alert and oriented. No focal neurological deficits.  Extremities: Symmetric 5 x 5 power. Peripheral pulses symmetrically felt.   Skin: No rashes or acute findings.  Musculoskeletal system: wrapped in ACE BLEs.  Psychiatry: Pleasant and cooperative.  Labs on Admission:  Basic Metabolic Panel: Recent Labs  Lab 07/30/18 1238  NA 135  K  2.5*  CL 84*  CO2 36*  GLUCOSE 115*  BUN 51*  CREATININE 1.46*  CALCIUM 9.2   Liver Function Tests: Recent Labs  Lab 07/30/18 1238  AST 20  ALT 24  ALKPHOS 85  BILITOT 1.0  PROT 6.8  ALBUMIN 3.9   No results for input(s): LIPASE, AMYLASE in the last 168 hours. No results for input(s): AMMONIA in the last 168 hours. CBC: Recent Labs  Lab 07/30/18 1238  WBC 8.8  NEUTROABS 7.1  HGB 12.7*  HCT 36.8*  MCV 89.8  PLT 214   Cardiac Enzymes: Recent Labs  Lab 07/30/18 1238  TROPONINI 0.08*    BNP (last 3 results) No results for input(s): PROBNP in the last  8760 hours. CBG: No results for input(s): GLUCAP in the last 168 hours.  Radiological Exams on Admission: Dg Chest 2 View  Result Date: 07/30/2018 CLINICAL DATA:  85 year old male with hypotension and weakness EXAM: CHEST - 2 VIEW COMPARISON:  Prior chest x-ray 04/20/2018 FINDINGS: The lungs are clear and negative pulmonary edema or suspicious pulmonary nodule. Questionable patchy airspace opacity in the lingula. Overall inspiratory volumes are low. No pleural effusion or pneumothorax. Cardiac and mediastinal contours are within normal limits. Atherosclerotic calcifications present in the transverse aorta. No acute fracture or lytic or blastic osseous lesions. The visualized upper abdominal bowel gas pattern is unremarkable. IMPRESSION: Low inspiratory volumes with patchy airspace opacity in the lingula which may reflect atelectasis or early bronchopneumonia. Aortic Atherosclerosis (ICD10-170.0) Electronically Signed   By: Jacqulynn Cadet M.D.   On: 07/30/2018 13:36   EKG: Independently reviewed.   Assessment/Plan Active Problems:   Generalized weakness   Hypertension   PD (Parkinson's disease) (HCC)   Neuropathy   Hypokalemia   Dehydration   Bilateral leg edema   Elevated troponin   Chronic pain   Hypotension   AKI (acute kidney injury) ([place])  1. Hypokalemia -likely secondary to the increased doses of Lasix  that has been taken over the past week.  Supplement potassium as ordered.  Follow magnesium.  Follow BMP. 2. Chronic pain given his acute kidney injury will slightly reduce opioid medication. 3. AKI- secondary to diuretics which are currently being held.  Continue IV fluid hydration and follow renal function. 4. Elevated troponin-suspect demand ischemia as patient has no symptoms of chest pain or EKG changes to suggest ACS. 5. Dehydration-treating with IV fluids. 6. Hypotension- secondary to volume depletion, holding blood pressure medications. 7. Neuropathy-treating with Lyrica however given his acute renal injury reducing the dose. 8. Generalized weakness-likely secondary to multiple comorbidities including Parkinson's disease.  PT evaluation requested. 9. Idiopathic Parkinson's disease-patient was recently seen by neurology and will continue medications as prescribed.  DVT Prophylaxis: Heparin Code Status: DNR Family Communication: Caretaker at bedside Disposition Plan: Return to SNF after discharge  Time spent: 59 minutes  Thayer Embleton Wynetta Emery, MD [facility] How to contact the [facility] Attending or Consulting provider [place] or covering provider during after hours [place], for this patient?  1. Check the care team in [facility] and look for a) attending/consulting TR[facility] provider listed and b) the [facility] team listed 2. Log into [email or web address] and use [place]'s universal password to access. If you Ramirez not have the password, please contact the hospital operator. 3. Locate the [facility] provider you are looking for under [facility] and page to a number that you can be directly reached. 4. If you still have difficulty reaching the provider, please page the [facility] (Director on Call) for the Hospitalists listed on amion for assistance.

## 2018-07-30 NOTE — ED Triage Notes (Signed)
Pt from [place] for evaluation of hypotension starting last week.  NP at nursing home states the pt was taken off of all of his blood pressure medications last week for same.  Has been getting Lasix and is concerned about kidney function.

## 2018-07-30 NOTE — ED Provider Notes (Addendum)
[facility] EMERGENCY DEPARTMENT Provider Note   CSN: [id number] Arrival date & time: 07/30/18  1104     History   Chief Complaint Chief Complaint  Patient presents with  . Hypotension    HPI Julian Ramirez is a 85 y.o. male.  Patient brought to the emergency department for hypotension and weakness.  Supposedly his blood pressure has been low this week and they stopped his blood pressure medicine.  He is also been getting Lasix for swelling to his legs.  The history is provided by the patient. No language interpreter was used.  Weakness  Severity:  Moderate Onset quality:  Gradual Timing:  Constant Progression:  Waxing and waning Chronicity:  New Context: not alcohol use   Relieved by:  Nothing Worsened by:  Nothing Ineffective treatments:  None tried Associated symptoms: no abdominal pain, no chest pain, no cough, no diarrhea, no frequency, no headaches and no seizures   Risk factors: no anemia     Past Medical History:  Diagnosis Date  . Chronic pain   . Edema   . Hypertension   . Neuropathy   . Parkinson disease [facility])     Patient Active Problem List   Diagnosis Date Noted  . Dehydration 07/30/2018  . Bilateral leg edema 07/30/2018  . Elevated troponin 07/30/2018  . Chronic pain 07/30/2018  . Hypotension 07/30/2018  . AKI (acute kidney injury) ([place]) 07/30/2018  . Common bile duct dilatation   . Intrahepatic bile duct dilation   . Serum total bilirubin elevated   . Hypokalemia   . Varicose veins of bilateral lower extremities with other complications 54/27/0623  . Rhabdomyolysis 06/29/2013  . Fall 06/29/2013  . Generalized weakness 06/29/2013  . Acute renal failure ([place]) 06/29/2013  . Leukocytosis 06/29/2013  . Anemia 06/29/2013  . Thrombocytopenia ([place]) 06/29/2013  . Weakness 06/29/2013  . Hypertension   . PD (Parkinson's disease) ([place])   . Neuropathy   . Edema     Past Surgical History:  Procedure Laterality Date  . BACK SURGERY    .  BRAIN SURGERY    . CHOLECYSTECTOMY  2017   [facility]  . CORONARY ANGIOPLASTY WITH STENT PLACEMENT          Home Medications    Prior to Admission medications   Medication Sig Start Date End Date Taking? Authorizing Provider  acetaminophen (TYLENOL) 325 MG tablet Take 650 mg by mouth every 6 (six) hours as needed.   Yes [provider]  Amino Acids-Protein Hydrolys (FEEDING SUPPLEMENT, PRO-STAT SUGAR FREE 64,) LIQD Take 30 mLs by mouth daily.   Yes [provider]  antiseptic oral rinse (BIOTENE) LIQD 15 mLs by Mouth Rinse route as needed for dry mouth.   Yes [provider]  Baclofen 5 MG TABS Take 5 mg by mouth 2 (two) times daily. 01/31/18  Yes [provider]  carbidopa-levodopa (SINEMET IR) 25-100 MG tablet Take 2 tablets by mouth 3 (three) times daily. 7 am. 11 am. 4 pm. 02/17/18  Yes Tat, Rebecca S, DO  dexamethasone (DECADRON) 4 MG tablet Take 4 mg by mouth daily.   Yes [provider]  docusate sodium (COLACE) 100 MG capsule Take 100 mg by mouth daily.   Yes [provider]  DULoxetine (CYMBALTA) 30 MG capsule Take 30 mg by mouth daily. 01/30/18  Yes [provider]  ferrous sulfate 325 (65 FE) MG tablet Take 325 mg by mouth 3 (three) times daily with meals.   Yes [provider]  finasteride (PROSCAR) 5 MG tablet Take 5 mg by mouth daily.   Yes [provider]  furosemide (LASIX) 20 MG tablet Take by mouth. 80 in the morning, 60 in the afternoon   Yes [provider]  guaifenesin (ROBITUSSIN) 100 MG/5ML syrup Take 200 mg by mouth 3 (three) times daily as needed for cough.   Yes [provider]  Menthol (ICY HOT) 5 % PTCH Apply topically.   Yes [provider]  metolazone (ZAROXOLYN) 2.5 MG tablet Take 2.5 mg by mouth daily. Prior to lasix   Yes [provider]  Multiple Vitamin (MULTIVITAMIN) tablet Take 1 tablet by mouth daily.   Yes [provider]  Oxycodone  HCl 10 MG TABS Take 10 mg by mouth every 8 (eight) hours.   Yes [provider]  polyethylene glycol (MIRALAX / GLYCOLAX) packet Take 17 g by mouth daily.   Yes [provider]  potassium chloride SA (K-DUR,KLOR-CON) 20 MEQ tablet Take 20 mEq by mouth daily.   Yes [provider]  pregabalin (LYRICA) 100 MG capsule Take 100 mg by mouth 3 (three) times daily. 02/07/18  Yes [provider]  tamsulosin (FLOMAX) 0.4 MG CAPS capsule Take 0.4 mg by mouth at bedtime.   Yes [provider]  traZODone (DESYREL) 50 MG tablet Take 50 mg by mouth at bedtime.   Yes [provider]    Family History Family History  Problem Relation Age of Onset  . Heart attack Mother   . Heart attack Father   . Cancer Sister     Social History Social History   Tobacco Use  . Smoking status: Never Smoker  . Smokeless tobacco: Never Used  Substance Use Topics  . Alcohol use: No  . Drug use: No     Allergies   Penicillins; Peanuts [peanut oil]; and Wheat bran   Review of Systems Review of Systems  Constitutional: Negative for appetite change and fatigue.  HENT: Negative for congestion, ear discharge and sinus pressure.        Mucous membranes  Eyes: Negative for discharge.  Respiratory: Negative for cough.   Cardiovascular: Negative for chest pain.  Gastrointestinal: Negative for abdominal pain and diarrhea.  Genitourinary: Negative for frequency and hematuria.  Musculoskeletal: Negative for back pain.  Skin: Negative for rash.  Neurological: Positive for weakness. Negative for seizures and headaches.  Psychiatric/Behavioral: Negative for hallucinations.     Physical Exam Updated Vital Signs BP 115/60 (BP Location: Left Arm)   Pulse 63   Resp 15   Ht 6' (1.829 m)   Wt 84 kg   SpO2 99%   BMI 25.12 kg/m   Physical Exam Vitals signs and nursing note reviewed.  Constitutional:      Appearance: He is well-developed.  HENT:     Head:  Normocephalic.     Comments: Dry mucous membrane    Nose: Nose normal.  Eyes:     General: No scleral icterus.    Conjunctiva/sclera: Conjunctivae normal.  Neck:     Musculoskeletal: Neck supple.     Thyroid: No thyromegaly.  Cardiovascular:     Rate and Rhythm: Normal rate and regular rhythm.     Heart sounds: No murmur. No friction rub. No gallop.   Pulmonary:     Breath sounds: No stridor. No wheezing or rales.  Chest:     Chest wall: No tenderness.  Abdominal:     General: There is no distension.     Tenderness:  There is no abdominal tenderness. There is no rebound.  Musculoskeletal: Normal range of motion.        General: Swelling present.  Lymphadenopathy:     Cervical: No cervical adenopathy.  Skin:    Findings: No erythema or rash.  Neurological:     Mental Status: He is oriented to person, place, and time.     Motor: No abnormal muscle tone.     Coordination: Coordination normal.  Psychiatric:        Behavior: Behavior normal.      ED Treatments / Results  Labs (all labs ordered are listed, but only abnormal results are displayed) Labs Reviewed  CBC WITH DIFFERENTIAL/PLATELET - Abnormal; Notable for the following components:      Result Value   RBC 4.10 (*)    Hemoglobin 12.7 (*)    HCT 36.8 (*)    Abs Immature Granulocytes 0.16 (*)    All other components within normal limits  COMPREHENSIVE METABOLIC PANEL - Abnormal; Notable for the following components:   Potassium 2.5 (*)    Chloride 84 (*)    CO2 36 (*)    Glucose, Bld 115 (*)    BUN 51 (*)    Creatinine, Ser 1.46 (*)    GFR calc non Af Amer 42 (*)    GFR calc Af Amer 49 (*)    All other components within normal limits  TROPONIN I - Abnormal; Notable for the following components:   Troponin I 0.08 (*)    All other components within normal limits  BRAIN NATRIURETIC PEPTIDE  MAGNESIUM  URINALYSIS, ROUTINE W REFLEX MICROSCOPIC    EKG EKG Interpretation  Date/Time:  Saturday July 30 2018 11:49:59 EST Ventricular Rate:  71 PR Interval:    QRS Duration: 97 QT Interval:  416 QTC Calculation: 453 R Axis:   -14 Text Interpretation:  Sinus rhythm Abnormal R-wave progression, early transition Baseline wander in lead(s) III Confirmed by Milton Ferguson [phone]) on 07/30/2018 1:53:41 PM   Radiology Dg Chest 2 View  Result Date: 07/30/2018 CLINICAL DATA:  85 year old male with hypotension and weakness EXAM: CHEST - 2 VIEW COMPARISON:  Prior chest x-ray 04/20/2018 FINDINGS: The lungs are clear and negative pulmonary edema or suspicious pulmonary nodule. Questionable patchy airspace opacity in the lingula. Overall inspiratory volumes are low. No pleural effusion or pneumothorax. Cardiac and mediastinal contours are within normal limits. Atherosclerotic calcifications present in the transverse aorta. No acute fracture or lytic or blastic osseous lesions. The visualized upper abdominal bowel gas pattern is unremarkable. IMPRESSION: Low inspiratory volumes with patchy airspace opacity in the lingula which may reflect atelectasis or early bronchopneumonia. Aortic Atherosclerosis (ICD10-170.0) Electronically Signed   By: Jacqulynn Cadet M.D.   On: 07/30/2018 13:36    Procedures Procedures (including critical care time)  Medications Ordered in ED Medications  potassium chloride 10 mEq in 100 mL IVPB (10 mEq Intravenous New Bag/Given 07/30/18 1501)  sodium chloride 0.9 % bolus 1,000 mL (0 mLs Intravenous Stopped 07/30/18 1346)     Initial Impression / Assessment and Plan / ED Course  I have reviewed the triage vital signs and the nursing notes.  Pertinent labs & imaging results that were available during my care of the patient were reviewed by me and considered in my medical decision making (see chart for details).    CRITICAL CARE Performed by: Milton Ferguson Total critical care time: 35 minutes Critical care time was exclusive of separately billable procedures and treating other  patients.  Critical care was necessary to treat or prevent imminent or life-threatening deterioration. Critical care was time spent personally by me on the following activities: development of treatment plan with patient and/or surrogate as well as nursing, discussions with consultants, evaluation of patient's response to treatment, examination of patient, obtaining history from patient or surrogate, ordering and performing treatments and interventions, ordering and review of laboratory studies, ordering and review of radiographic studies, pulse oximetry and re-evaluation of patient's condition.  Patient with hypokalemia probably secondary to Lasix recently.  Patient also dehydrated and has elevated troponin.  He will be admitted to medicine for fluids and work-up for elevated troponin  Final Clinical Impressions(s) / ED Diagnoses   Final diagnoses:  Hypokalemia    ED Discharge Orders    None       Milton Ferguson, MD 07/30/18 1517    Milton Ferguson, MD 08/09/18 1258

## 2018-07-31 ENCOUNTER — Inpatient Hospital Stay (HOSPITAL_COMMUNITY): Payer: Medicare Other

## 2018-07-31 DIAGNOSIS — G894 Chronic pain syndrome: Secondary | ICD-10-CM | POA: Diagnosis not present

## 2018-07-31 DIAGNOSIS — L8990 Pressure ulcer of unspecified site, unspecified stage: Secondary | ICD-10-CM

## 2018-07-31 DIAGNOSIS — N179 Acute kidney failure, unspecified: Secondary | ICD-10-CM | POA: Diagnosis not present

## 2018-07-31 DIAGNOSIS — R600 Localized edema: Secondary | ICD-10-CM | POA: Diagnosis not present

## 2018-07-31 DIAGNOSIS — R531 Weakness: Secondary | ICD-10-CM | POA: Diagnosis not present

## 2018-07-31 DIAGNOSIS — E860 Dehydration: Secondary | ICD-10-CM | POA: Diagnosis not present

## 2018-07-31 LAB — MRSA PCR SCREENING: MRSA by PCR: POSITIVE — AB

## 2018-07-31 LAB — COMPREHENSIVE METABOLIC PANEL
ALT: 8 U/L (ref 0–44)
AST: 18 U/L (ref 15–41)
Albumin: 3.5 g/dL (ref 3.5–5.0)
Alkaline Phosphatase: 78 U/L (ref 38–126)
Anion gap: 12 (ref 5–15)
BUN: 43 mg/dL — ABNORMAL HIGH (ref 8–23)
CO2: 30 mmol/L (ref 22–32)
Calcium: 8.6 mg/dL — ABNORMAL LOW (ref 8.9–10.3)
Chloride: 93 mmol/L — ABNORMAL LOW (ref 98–111)
Creatinine, Ser: 1.21 mg/dL (ref 0.61–1.24)
GFR calc non Af Amer: 53 mL/min — ABNORMAL LOW (ref >60)
Glucose, Bld: 137 mg/dL — ABNORMAL HIGH (ref 70–99)
Potassium: 3.3 mmol/L — ABNORMAL LOW (ref 3.5–5.1)
Sodium: 135 mmol/L (ref 135–145)
Total Bilirubin: 1.3 mg/dL — ABNORMAL HIGH (ref 0.3–1.2)
Total Protein: 6.3 g/dL — ABNORMAL LOW (ref 6.5–8.1)

## 2018-07-31 LAB — CBC
HEMATOCRIT: 34.8 % — AB (ref 39.0–52.0)
Hemoglobin: 11.5 g/dL — ABNORMAL LOW (ref 13.0–17.0)
MCH: 29.9 pg (ref 26.0–34.0)
MCHC: 33 g/dL (ref 30.0–36.0)
MCV: 90.6 fL (ref 80.0–100.0)
Platelets: 183 10*3/uL (ref 150–400)
RBC: 3.84 MIL/uL — ABNORMAL LOW (ref 4.22–5.81)
RDW: 15 % (ref 11.5–15.5)
WBC: 6.4 10*3/uL (ref 4.0–10.5)
nRBC: 0 % (ref 0.0–0.2)

## 2018-07-31 LAB — TROPONIN I
Troponin I: 0.06 ng/mL (ref <0.03)
Troponin I: 0.07 ng/mL (ref <0.03)

## 2018-07-31 LAB — MAGNESIUM: Magnesium: 2.1 mg/dL (ref 1.7–2.4)

## 2018-07-31 MED ORDER — CHLORHEXIDINE GLUCONATE CLOTH 2 % EX PADS
6.0000 | MEDICATED_PAD | Freq: Every day | CUTANEOUS | Status: DC
  Administered 2018-08-01: 6 via TOPICAL

## 2018-07-31 MED ORDER — MUPIROCIN 2 % EX OINT
1.0000 "application " | TOPICAL_OINTMENT | Freq: Two times a day (BID) | CUTANEOUS | Status: DC
  Administered 2018-07-31 – 2018-08-01 (×2): 1 via NASAL
  Filled 2018-07-31: qty 22

## 2018-07-31 MED ORDER — APIXABAN 5 MG PO TABS
10.0000 mg | ORAL_TABLET | Freq: Two times a day (BID) | ORAL | Status: DC
  Administered 2018-07-31 – 2018-08-01 (×3): 10 mg via ORAL
  Filled 2018-07-31 (×3): qty 2

## 2018-07-31 MED ORDER — POTASSIUM CHLORIDE 10 MEQ/100ML IV SOLN
10.0000 meq | INTRAVENOUS | Status: AC
  Administered 2018-07-31 (×2): 10 meq via INTRAVENOUS
  Filled 2018-07-31 (×2): qty 100

## 2018-07-31 MED ORDER — APIXABAN 5 MG PO TABS: 5.0000 mg | ORAL_TABLET | Freq: Two times a day (BID) | ORAL | Status: DC

## 2018-07-31 NOTE — Progress Notes (Signed)
ANTICOAGULATION CONSULT NOTE - Initial Consult  Pharmacy Consult for Apixaban Indication: DVT  Allergies  Allergen Reactions  . Penicillins Shortness Of Breath and Swelling    Mouth swells   . Peanuts [Peanut Oil] Swelling    Causes swelling in the lower body areas.   . Wheat Bran Swelling    Wheat four causes swelling in the lower body areas     Patient Measurements: Height: 6' (182.9 cm) Weight: 193 lb 4.8 oz (87.7 kg) IBW/kg (Calculated) : 77.6  Vital Signs: Temp: 97.5 F (36.4 C) (02/09 0618) Temp Source: Oral (02/09 0618) BP: 108/65 (02/09 0618) Pulse Rate: 78 (02/09 0618)  Labs: Recent Labs    07/30/18 1238 07/30/18 1906 07/31/18 0502 07/31/18 1126  HGB 12.7*  --  11.5*  --   HCT 36.8*  --  34.8*  --   PLT 214  --  183  --   CREATININE 1.46*  --  1.21  --   TROPONINI 0.08* 0.07* 0.06* 0.07*    Estimated Creatinine Clearance: 45.4 mL/min (by C-G formula based on SCr of 1.21 mg/dL).   Medical History: Past Medical History:  Diagnosis Date  . Chronic pain   . Edema   . Hypertension   . Neuropathy   . Parkinson disease ([place])     Medications:  Medications Prior to Admission  Medication Sig Dispense Refill Last Dose  . acetaminophen (TYLENOL) 325 MG tablet Take 650 mg by mouth every 6 (six) hours as needed.   unknown  . Amino Acids-Protein Hydrolys (FEEDING SUPPLEMENT, PRO-STAT SUGAR FREE 64,) LIQD Take 30 mLs by mouth daily.   07/29/2018 at Unknown time  . antiseptic oral rinse (BIOTENE) LIQD 15 mLs by Mouth Rinse route as needed for dry mouth.   07/29/2018 at Unknown time  . Baclofen 5 MG TABS Take 5 mg by mouth 2 (two) times daily.   07/29/2018 at Unknown time  . carbidopa-levodopa (SINEMET IR) 25-100 MG tablet Take 2 tablets by mouth 3 (three) times daily. 7 am. 11 am. 4 pm. 540 tablet 1 07/30/2018 at Unknown time  . dexamethasone (DECADRON) 4 MG tablet Take 4 mg by mouth daily.   07/29/2018 at Unknown time  . docusate sodium (COLACE) 100 MG capsule Take 100  mg by mouth daily.   07/29/2018 at Unknown time  . DULoxetine (CYMBALTA) 30 MG capsule Take 30 mg by mouth daily.   07/29/2018 at Unknown time  . ferrous sulfate 325 (65 FE) MG tablet Take 325 mg by mouth 3 (three) times daily with meals.   07/29/2018 at Unknown time  . finasteride (PROSCAR) 5 MG tablet Take 5 mg by mouth daily.   07/29/2018 at Unknown time  . furosemide (LASIX) 20 MG tablet Take by mouth. 80 in the morning, 60 in the afternoon   07/29/2018 at Unknown time  . guaifenesin (ROBITUSSIN) 100 MG/5ML syrup Take 200 mg by mouth 3 (three) times daily as needed for cough.   07/23/2018  . Menthol (ICY HOT) 5 % PTCH Apply topically.   07/29/2018 at Unknown time  . metolazone (ZAROXOLYN) 2.5 MG tablet Take 2.5 mg by mouth daily. Prior to lasix   07/29/2018 at Unknown time  . Multiple Vitamin (MULTIVITAMIN) tablet Take 1 tablet by mouth daily.   07/29/2018 at Unknown time  . Oxycodone HCl 10 MG TABS Take 10 mg by mouth every 8 (eight) hours.   07/30/2018 at Unknown time  . polyethylene glycol (MIRALAX / GLYCOLAX) packet Take 17 g by mouth  daily.   07/29/2018 at Unknown time  . potassium chloride SA (K-DUR,KLOR-CON) 20 MEQ tablet Take 20 mEq by mouth daily.   07/29/2018 at Unknown time  . pregabalin (LYRICA) 100 MG capsule Take 100 mg by mouth 3 (three) times daily.   07/29/2018 at Unknown time  . tamsulosin (FLOMAX) 0.4 MG CAPS capsule Take 0.4 mg by mouth at bedtime.   07/29/2018 at Unknown time  . traZODone (DESYREL) 50 MG tablet Take 50 mg by mouth at bedtime.   07/29/2018 at Unknown time    Assessment: 86yo male admitted for evaluation for evaluation weakness, hypotenion and hypokalemia. Doppler US positive for bilateral DVT could not rule out acute on chronic on right. Pharmacy asked to dose apixaban. 88kg, Scr 1.21  Goal of Therapy: Monitor platelets by anticoagulation protocol: Yes   Plan:  Eliquis 10mg  po bid for 7 days, then 5mg  po BID Monitor for s/s of bleeding  Educate on eliquis  Isac Sarna, BS  Vena [place], BCPS Clinical Pharmacist Pager [phone] 07/31/2018,12:43 PM

## 2018-07-31 NOTE — Plan of Care (Signed)
CRITICAL VALUE ALERT  Critical Value:  MRSA positive  Date & Time Notied:  10:30  Provider Notified: Notified primary RN, Katharine Look  Orders Received/Actions taken: Notified primary RN, Katharine Look

## 2018-07-31 NOTE — Progress Notes (Signed)
07/31/2018 12:32 PM  Doppler US positive for bilateral DVT could not rule out acute on chronic on Right.  Will ask pharmacist to assist with dosing apixaban.  Continue TED hoses.   Murvin Natal MD

## 2018-07-31 NOTE — Progress Notes (Signed)
PROGRESS NOTE  Julian Ramirez  IWL:[id number]  DOB: [birth year]  DOA: 07/30/2018 PCP: Caryl Bis, MD  Brief Admission Hx: 85 y.o. male from [facility] for evaluation of hypotension, weakness and hypokalemia.    MDM/Assessment & Plan:   1. Hypokalemia -likely secondary to the increased doses of Lasix that has been taken over the past week.  Supplement potassium as ordered.  Follow magnesium.  Follow BMP. 2. Chronic pain given his acute kidney injury will slightly reduce opioid medication. 3. AKI- secondary to diuretics which are currently being held.  Continue IV fluid hydration and follow renal function. 4. Elevated troponin-suspect demand ischemia as patient has no symptoms of chest pain or EKG changes to suggest ACS.  Echocardiogram ordered.   5. Dehydration-treating with IV fluids. 6. Hypotension- RESOLVED.  secondary to volume depletion, holding blood pressure medications. 7. Neuropathy-treating with Lyrica however given his acute renal injury reducing the dose. 8. Generalized weakness-likely secondary to multiple comorbidities including Parkinson's disease.  PT evaluation requested. 9. Idiopathic Parkinson's disease-patient was recently seen by neurology and will continue medications as prescribed. 10. Chronic LE edema - will provide TED hoses and heel protection.    DVT Prophylaxis: Heparin Code Status: DNR Family Communication: Caretaker at bedside Disposition Plan: Return to SNF after discharge  Consultants:  N/a    Procedures:  N/a  Antimicrobials:  n/a   Subjective: Pt without complaints.  Says he has to urinate not realizing he has a cath in place.   Objective: Vitals:   07/30/18 1736 07/30/18 1804 07/30/18 2217 07/31/18 0618  BP: 105/68 123/70 (!) 101/59 108/65  Pulse: 69 64 76 78  Resp: 18 19 20    Temp:  97.8 F (36.6 C) 99.4 F (37.4 C) (!) 97.5 F (36.4 C)  TempSrc:  Oral Oral Oral  SpO2: 98% 98% 94% 97%  Weight:  87.7 kg    Height:  6'  (1.829 m)      Intake/Output Summary (Last 24 hours) at 07/31/2018 0954 Last data filed at 07/31/2018 0700 Gross per 24 hour  Intake 1740.83 ml  Output 1400 ml  Net 340.83 ml   Filed Weights   07/30/18 1107 07/30/18 1804  Weight: 84 kg 87.7 kg   REVIEW OF SYSTEMS  As per history otherwise all reviewed and reported negative  Exam:  General exam: awake, alert, NAD, a little confused this morning.   Respiratory system: BBS CTA.  No increased work of breathing. Cardiovascular system: S1 & S2 heard. No JVD, murmurs, gallops, clicks or pedal edema. Gastrointestinal system: Abdomen is nondistended, soft and nontender. Normal bowel sounds heard. Central nervous system: Alert and some confused. No focal neurological deficits. Extremities: 1+ edema bilateral LEs. Onychomycosis.   Data Reviewed: Basic Metabolic Panel: Recent Labs  Lab 07/30/18 1238 07/31/18 0502  NA 135 135  K 2.5* 3.3*  CL 84* 93*  CO2 36* 30  GLUCOSE 115* 137*  BUN 51* 43*  CREATININE 1.46* 1.21  CALCIUM 9.2 8.6*  MG 2.2 2.1   Liver Function Tests: Recent Labs  Lab 07/30/18 1238 07/31/18 0502  AST 20 18  ALT 24 8  ALKPHOS 85 78  BILITOT 1.0 1.3*  PROT 6.8 6.3*  ALBUMIN 3.9 3.5   No results for input(s): LIPASE, AMYLASE in the last 168 hours. No results for input(s): AMMONIA in the last 168 hours. CBC: Recent Labs  Lab 07/30/18 1238 07/31/18 0502  WBC 8.8 6.4  NEUTROABS 7.1  --   HGB 12.7* 11.5*  HCT  36.8* 34.8*  MCV 89.8 90.6  PLT 214 183   Cardiac Enzymes: Recent Labs  Lab 07/30/18 1238 07/30/18 1906 07/31/18 0502  TROPONINI 0.08* 0.07* 0.06*   CBG (last 3)  No results for input(s): GLUCAP in the last 72 hours. No results found for this or any previous visit (from the past 240 hour(s)).   Studies: Dg Chest 2 View  Result Date: 07/30/2018 CLINICAL DATA:  85 year old male with hypotension and weakness EXAM: CHEST - 2 VIEW COMPARISON:  Prior chest x-ray 04/20/2018 FINDINGS: The  lungs are clear and negative pulmonary edema or suspicious pulmonary nodule. Questionable patchy airspace opacity in the lingula. Overall inspiratory volumes are low. No pleural effusion or pneumothorax. Cardiac and mediastinal contours are within normal limits. Atherosclerotic calcifications present in the transverse aorta. No acute fracture or lytic or blastic osseous lesions. The visualized upper abdominal bowel gas pattern is unremarkable. IMPRESSION: Low inspiratory volumes with patchy airspace opacity in the lingula which may reflect atelectasis or early bronchopneumonia. Aortic Atherosclerosis (ICD10-170.0) Electronically Signed   By: Jacqulynn Cadet M.D.   On: 07/30/2018 13:36   Scheduled Meds: . baclofen  5 mg Oral BID  . carbidopa-levodopa  2 tablet Oral TID  . dexamethasone  4 mg Oral Daily  . docusate sodium  100 mg Oral Daily  . DULoxetine  30 mg Oral Daily  . feeding supplement (PRO-STAT SUGAR FREE 64)  30 mL Oral Daily  . ferrous sulfate  325 mg Oral Q breakfast  . finasteride  5 mg Oral Daily  . heparin  5,000 Units Subcutaneous Q8H  . multivitamin with minerals  1 tablet Oral Daily  . oxyCODONE  5 mg Oral Q8H  . polyethylene glycol  17 g Oral Daily  . potassium chloride SA  20 mEq Oral Daily  . pregabalin  75 mg Oral TID  . tamsulosin  0.4 mg Oral QHS  . traZODone  50 mg Oral QHS   Continuous Infusions: . 0.9 % NaCl with KCl 20 mEq / L 65 mL/hr at 07/30/18 2003    Active Problems:   Generalized weakness   Hypertension   PD (Parkinson's disease) ([place])   Neuropathy   Hypokalemia   Dehydration   Bilateral leg edema   Elevated troponin   Chronic pain   Hypotension   AKI (acute kidney injury) ([place])   Pressure injury of skin  Time spent:   Irwin Brakeman, MD [facility] 07/31/2018, 9:54 AM    LOS: 1 day  How to contact the [facility] Attending or Consulting provider [employer] or covering provider during after hours [place], for this patient?  1. Check the care  team in [facility] and look for a) attending/consulting [facility] provider listed and b) the [facility] team listed 2. Log into [email or web address] and use [place] universal password to access. If you do not have the password, please contact the hospital operator. 3. Locate the [facility] provider you are looking for under [facility] and page to a number that you can be directly reached. 4. If you still have difficulty reaching the provider, please page the [facility] (Director on Call) for the Hospitalists listed on amion for assistance.

## 2018-07-31 NOTE — Progress Notes (Signed)
07/31/2018 3:59 PM  I spoke with patient's son and updated.  He wasn't sure why patient was on the dexamethasone, says it might have been recently started in nursing facility.  He will check on it.  Says patient is longterm resident of [facility] and will be accepted back at any time.    Murvin Natal MD

## 2018-08-01 ENCOUNTER — Observation Stay (HOSPITAL_BASED_OUTPATIENT_CLINIC_OR_DEPARTMENT_OTHER): Payer: Medicare Other

## 2018-08-01 ENCOUNTER — Encounter (HOSPITAL_COMMUNITY): Payer: Self-pay | Admitting: Family Medicine

## 2018-08-01 DIAGNOSIS — G894 Chronic pain syndrome: Secondary | ICD-10-CM | POA: Diagnosis not present

## 2018-08-01 DIAGNOSIS — N179 Acute kidney failure, unspecified: Secondary | ICD-10-CM | POA: Diagnosis not present

## 2018-08-01 DIAGNOSIS — I361 Nonrheumatic tricuspid (valve) insufficiency: Secondary | ICD-10-CM | POA: Diagnosis not present

## 2018-08-01 DIAGNOSIS — I129 Hypertensive chronic kidney disease with stage 1 through stage 4 chronic kidney disease, or unspecified chronic kidney disease: Secondary | ICD-10-CM | POA: Diagnosis not present

## 2018-08-01 DIAGNOSIS — I351 Nonrheumatic aortic (valve) insufficiency: Secondary | ICD-10-CM

## 2018-08-01 DIAGNOSIS — R600 Localized edema: Secondary | ICD-10-CM | POA: Diagnosis not present

## 2018-08-01 DIAGNOSIS — R531 Weakness: Secondary | ICD-10-CM | POA: Diagnosis not present

## 2018-08-01 DIAGNOSIS — Z66 Do not resuscitate: Secondary | ICD-10-CM | POA: Diagnosis not present

## 2018-08-01 DIAGNOSIS — E860 Dehydration: Secondary | ICD-10-CM | POA: Diagnosis not present

## 2018-08-01 LAB — ECHOCARDIOGRAM COMPLETE
Height: 72 in
Weight: 3092.8 oz

## 2018-08-01 LAB — COMPREHENSIVE METABOLIC PANEL
ALBUMIN: 3.1 g/dL — AB (ref 3.5–5.0)
ALT: 8 U/L (ref 0–44)
AST: 18 U/L (ref 15–41)
Alkaline Phosphatase: 67 U/L (ref 38–126)
Anion gap: 12 (ref 5–15)
BUN: 40 mg/dL — ABNORMAL HIGH (ref 8–23)
CO2: 30 mmol/L (ref 22–32)
Calcium: 8.6 mg/dL — ABNORMAL LOW (ref 8.9–10.3)
Chloride: 93 mmol/L — ABNORMAL LOW (ref 98–111)
Creatinine, Ser: 1.15 mg/dL (ref 0.61–1.24)
GFR calc Af Amer: >60 mL/min (ref >60)
GFR calc non Af Amer: 56 mL/min — ABNORMAL LOW (ref >60)
GLUCOSE: 114 mg/dL — AB (ref 70–99)
Potassium: 3 mmol/L — ABNORMAL LOW (ref 3.5–5.1)
Sodium: 135 mmol/L (ref 135–145)
Total Bilirubin: 1.2 mg/dL (ref 0.3–1.2)
Total Protein: 5.8 g/dL — ABNORMAL LOW (ref 6.5–8.1)

## 2018-08-01 LAB — TROPONIN I: Troponin I: 0.07 ng/mL (ref <0.03)

## 2018-08-01 LAB — MAGNESIUM: Magnesium: 2 mg/dL (ref 1.7–2.4)

## 2018-08-01 MED ORDER — FUROSEMIDE 20 MG PO TABS: 20.0000 mg | ORAL_TABLET | ORAL | Status: DC

## 2018-08-01 MED ORDER — POTASSIUM CHLORIDE CRYS ER 20 MEQ PO TBCR
60.0000 meq | EXTENDED_RELEASE_TABLET | Freq: Every day | ORAL | Status: DC
  Administered 2018-08-01: 60 meq via ORAL
  Filled 2018-08-01: qty 3

## 2018-08-01 MED ORDER — PREGABALIN 75 MG PO CAPS: 75.0000 mg | ORAL_CAPSULE | Freq: Three times a day (TID) | ORAL | Status: AC

## 2018-08-01 MED ORDER — OXYCODONE HCL 5 MG PO TABS: 5.0000 mg | ORAL_TABLET | Freq: Three times a day (TID) | ORAL | 0 refills | Status: AC | PRN

## 2018-08-01 MED ORDER — OXYCODONE HCL 5 MG PO TABS: 5.0000 mg | ORAL_TABLET | Freq: Three times a day (TID) | ORAL | 0 refills | Status: DC

## 2018-08-01 MED ORDER — APIXABAN 5 MG PO TABS: ORAL_TABLET | ORAL | 0 refills | Status: AC

## 2018-08-01 NOTE — Care Management Obs Status (Deleted)
MEDICARE OBSERVATION STATUS NOTIFICATION   Patient Details  Name: Julian Ramirez MRN: [id number] Date of Birth: October 11, 1926   Medicare Observation Status Notification Given:  Yes    Sherald Barge, RN 08/01/2018, 1:14 PM

## 2018-08-01 NOTE — Clinical Social Work Note (Signed)
Message left for patient's son advising of discharge.   RN to call EMS when ready.   LCSW signing off.    Ikhlas Albo, Clydene Pugh, LCSW

## 2018-08-01 NOTE — Progress Notes (Signed)
Attempted to place TED hose on patient but patient stated that they were too tight and uncomfortable and did not want to wear them. Patient was educated about TED hose and verbalized understanding with teach back but still refused to wear them. Dr. Wynetta Emery was notified.

## 2018-08-01 NOTE — Progress Notes (Signed)
*  PRELIMINARY RESULTS* Echocardiogram 2D Echocardiogram has been performed.  Julian Ramirez 08/01/2018, 2:30 PM

## 2018-08-01 NOTE — Care Management Obs Status (Signed)
MEDICARE OBSERVATION STATUS NOTIFICATION   Patient Details  Name: Julian Ramirez MRN: [id number] Date of Birth: 05-16-1927   Medicare Observation Status Notification Given:  Other (see comment)(attempted to deliver telphonically to son)    Sherald Barge, RN 08/01/2018, 1:15 PM

## 2018-08-01 NOTE — Discharge Summary (Addendum)
Physician Discharge Summary  Julian Ramirez WLN:[id number] DOB: 04-22-1927 DOA: 07/30/2018  PCP: Caryl Bis, MD  Admit date: 07/30/2018 Discharge date: 08/01/2018  [name]: [name]  Recommendations for SNF   1. Eliquis: Take 2 tabs twice daily through 08/06/2018 then on 08/07/2018 start 1 tab p.o. twice daily 2. Please obtain BMP/CBC in 1-2 weeks to follow up.  3. Please monitor for signs and symptoms of bleeding. 4. Please have patient wear TED hose if possible and heel protection. 5. Please be careful with the diuretics.  If he does not eat or drink well please do not give Lasix and potassium. 6. Please follow up on 2D echocardiogram results from 08/01/18.   Discharge Condition: STABLE   CODE STATUS: DNR    Brief Hospitalization Summary: Please see all hospital notes, images, labs for full details of the hospitalization. HPI: Julian Ramirez is a 85 y.o. male from [place] for evaluation of hypotension starting last week. NP at nursing home states the pt was taken off of all of his blood pressure medications last week for same. Has been getting Lasix and is concerned about kidney function.  He has been taking lasix for increased edema in the legs which persists.  He denies having shortness of breath or chest pain at this time.  He does have Parkinson's disease and was recently seen by his neurologist for that.  It has been stable.  He has chronic pain and is on high-dose opioids.  He also has hypertension history but has been taken off his blood pressure medications last week due to low blood pressure readings.  According to nursing home records he is DNR.  ED Course: The patient was seen in the emergency department and noted to have a potassium of 2.5 and troponin of 0.08.  His vital signs were stable with a blood pressure of 107/51.  The patient was started on IV fluid hydration and IV potassium replacement.  He is being admitted for further  evaluation and management.  Brief Admission Hx: 85 y.o.malefrom [place] for evaluation of hypotension, weakness and hypokalemia.    MDM/Assessment & Plan:   1. Hypokalemia -treated with supplemental potassium which is going to be continued. Followed and replaced magnesium. Recommend rechecking BMP in 1 week at nursing facility. 2. Chronic pain given his chronic kidney disease we have reduced his oxycodone to 5 mg every 8 hours as needed severe pain. 3. AKI-secondary to diuretics which are currently being held. Continue IV fluid hydration and follow renal function. 4. Elevated troponin-suspect demand ischemia as patient has no symptoms of chest pain or EKG changes to suggest ACS.  Echocardiogram done before discharge. Follow up results with primary care providers.    5. Dehydration-treated with IV fluids. 6. Hypotension-RESOLVED.  secondary to volume depletion.   7. Neuropathy-treating with Lyrica however given his acute renal injury reduced the dose to 75 mg.  8. Generalized weakness-likely secondary to multiple comorbidities including Parkinson's disease. PT evaluation recommending skilled nursing facility placement at [facility] where he is a long-term resident. 9. Idiopathic Parkinson's disease-patient was recently seen by neurology and will continue medications as prescribed. 10. Chronic LE edema - will provide TED hoses and heel protection.      DVT Prophylaxis:Heparin Code Status:DNR Family Communication:Caretaker at bedside Disposition Plan:Return to SNF [facility]  Discharge Diagnoses:  Active Problems:   Generalized weakness   Hypertension   PD (Parkinson's disease) ([place])   Neuropathy   Hypokalemia  Dehydration   Bilateral leg edema   Elevated troponin   Chronic pain   Hypotension   AKI (acute kidney injury) ([place])   Pressure injury of skin   Discharge Instructions:  Allergies as of 08/01/2018      Reactions   Penicillins Shortness Of  Breath, Swelling   Mouth swells   Peanuts [peanut Oil] Swelling   Causes swelling in the lower body areas.    Wheat Bran Swelling   Wheat four causes swelling in the lower body areas       Medication List    STOP taking these medications   metolazone 2.5 MG tablet Commonly known as:  ZAROXOLYN     TAKE these medications   acetaminophen 325 MG tablet Commonly known as:  TYLENOL Take 650 mg by mouth every 6 (six) hours as needed.   antiseptic oral rinse Liqd 15 mLs by Mouth Rinse route as needed for dry mouth.   apixaban 5 MG Tabs tablet Commonly known as:  ELIQUIS Take 2 tabs BID thru 2/15, then 1 tab BID   Baclofen 5 MG Tabs Take 5 mg by mouth 2 (two) times daily.   carbidopa-levodopa 25-100 MG tablet Commonly known as:  SINEMET IR Take 2 tablets by mouth 3 (three) times daily. 7 am. 11 am. 4 pm.   dexamethasone 4 MG tablet Commonly known as:  DECADRON Take 4 mg by mouth daily.   docusate sodium 100 MG capsule Commonly known as:  COLACE Take 100 mg by mouth daily.   DULoxetine 30 MG capsule Commonly known as:  CYMBALTA Take 30 mg by mouth daily.   feeding supplement (PRO-STAT SUGAR FREE 64) Liqd Take 30 mLs by mouth daily.   ferrous sulfate 325 (65 FE) MG tablet Take 325 mg by mouth 3 (three) times daily with meals.   finasteride 5 MG tablet Commonly known as:  PROSCAR Take 5 mg by mouth daily.   furosemide 20 MG tablet Commonly known as:  LASIX Take 1 tablet (20 mg total) by mouth every other day. 80 in the morning, 60 in the afternoon Start taking on:  August 03, 2018 What changed:    how much to take  when to take this   guaifenesin 100 MG/5ML syrup Commonly known as:  ROBITUSSIN Take 200 mg by mouth 3 (three) times daily as needed for cough.   ICY HOT 5 % Ptch Generic drug:  Menthol Apply topically.   multivitamin tablet Take 1 tablet by mouth daily.   oxyCODONE 5 MG immediate release tablet Commonly known as:  Oxy  IR/ROXICODONE Take 1 tablet (5 mg total) by mouth every 8 (eight) hours as needed for up to 3 days for severe pain. What changed:    medication strength  how much to take  when to take this  reasons to take this   polyethylene glycol packet Commonly known as:  MIRALAX / GLYCOLAX Take 17 g by mouth daily.   potassium chloride SA 20 MEQ tablet Commonly known as:  K-DUR,KLOR-CON Take 20 mEq by mouth daily.   pregabalin 75 MG capsule Commonly known as:  LYRICA Take 1 capsule (75 mg total) by mouth 3 (three) times daily. What changed:    medication strength  how much to take   tamsulosin 0.4 MG Caps capsule Commonly known as:  FLOMAX Take 0.4 mg by mouth at bedtime.   traZODone 50 MG tablet Commonly known as:  DESYREL Take 50 mg by mouth at bedtime.  Allergies  Allergen Reactions  . Penicillins Shortness Of Breath and Swelling    Mouth swells   . Peanuts [Peanut Oil] Swelling    Causes swelling in the lower body areas.   . Wheat Bran Swelling    Wheat four causes swelling in the lower body areas    Allergies as of 08/01/2018      Reactions   Penicillins Shortness Of Breath, Swelling   Mouth swells   Peanuts [peanut Oil] Swelling   Causes swelling in the lower body areas.    Wheat Bran Swelling   Wheat four causes swelling in the lower body areas       Medication List    STOP taking these medications   metolazone 2.5 MG tablet Commonly known as:  ZAROXOLYN     TAKE these medications   acetaminophen 325 MG tablet Commonly known as:  TYLENOL Take 650 mg by mouth every 6 (six) hours as needed.   antiseptic oral rinse Liqd 15 mLs by Mouth Rinse route as needed for dry mouth.   apixaban 5 MG Tabs tablet Commonly known as:  ELIQUIS Take 2 tabs BID thru 2/15, then 1 tab BID   Baclofen 5 MG Tabs Take 5 mg by mouth 2 (two) times daily.   carbidopa-levodopa 25-100 MG tablet Commonly known as:  SINEMET IR Take 2 tablets by mouth 3 (three) times  daily. 7 am. 11 am. 4 pm.   dexamethasone 4 MG tablet Commonly known as:  DECADRON Take 4 mg by mouth daily.   docusate sodium 100 MG capsule Commonly known as:  COLACE Take 100 mg by mouth daily.   DULoxetine 30 MG capsule Commonly known as:  CYMBALTA Take 30 mg by mouth daily.   feeding supplement (PRO-STAT SUGAR FREE 64) Liqd Take 30 mLs by mouth daily.   ferrous sulfate 325 (65 FE) MG tablet Take 325 mg by mouth 3 (three) times daily with meals.   finasteride 5 MG tablet Commonly known as:  PROSCAR Take 5 mg by mouth daily.   furosemide 20 MG tablet Commonly known as:  LASIX Take 1 tablet (20 mg total) by mouth every other day. 80 in the morning, 60 in the afternoon Start taking on:  August 03, 2018 What changed:    how much to take  when to take this   guaifenesin 100 MG/5ML syrup Commonly known as:  ROBITUSSIN Take 200 mg by mouth 3 (three) times daily as needed for cough.   ICY HOT 5 % Ptch Generic drug:  Menthol Apply topically.   multivitamin tablet Take 1 tablet by mouth daily.   oxyCODONE 5 MG immediate release tablet Commonly known as:  Oxy IR/ROXICODONE Take 1 tablet (5 mg total) by mouth every 8 (eight) hours as needed for up to 3 days for severe pain. What changed:    medication strength  how much to take  when to take this  reasons to take this   polyethylene glycol packet Commonly known as:  MIRALAX / GLYCOLAX Take 17 g by mouth daily.   potassium chloride SA 20 MEQ tablet Commonly known as:  K-DUR,KLOR-CON Take 20 mEq by mouth daily.   pregabalin 75 MG capsule Commonly known as:  LYRICA Take 1 capsule (75 mg total) by mouth 3 (three) times daily. What changed:    medication strength  how much to take   tamsulosin 0.4 MG Caps capsule Commonly known as:  FLOMAX Take 0.4 mg by mouth at bedtime.   traZODone 50  MG tablet Commonly known as:  DESYREL Take 50 mg by mouth at bedtime.       Procedures/Studies:  ECHOCARDIOGRAM 08/01/18 IMPRESSIONS  1. The left ventricle has normal systolic function of 26-83%. The cavity size was normal. There is moderately increased left ventricular wall thickness. Abnormal diastolic function, indeterminant grade.  2. The right ventricle has normal systolic function. The cavity was normal. There is no increase in right ventricular wall thickness.  3. The mitral valve is normal in structure. There is mild thickening. No evidence of mitral valve stenosis.  4. The tricuspid valve is normal in structure.  5. The aortic valve is tricuspid There is severe thickening and severe calcifcation of the aortic valve. Aortic valve regurgitation is mild by color flow Doppler. The calculated aortic valve area is 1.31 cm, consistent with mild-moderate stenosis.  6. There is mild dilatation of the aortic root.  7. Pulmonary hypertension is Inadequate TR jet, cannot estimated RA pressure.  8. The interatrial septum was not well visualized.   Dg Chest 2 View  Result Date: 07/30/2018 CLINICAL DATA:  85 year old male with hypotension and weakness EXAM: CHEST - 2 VIEW COMPARISON:  Prior chest x-ray 04/20/2018 FINDINGS: The lungs are clear and negative pulmonary edema or suspicious pulmonary nodule. Questionable patchy airspace opacity in the lingula. Overall inspiratory volumes are low. No pleural effusion or pneumothorax. Cardiac and mediastinal contours are within normal limits. Atherosclerotic calcifications present in the transverse aorta. No acute fracture or lytic or blastic osseous lesions. The visualized upper abdominal bowel gas pattern is unremarkable. IMPRESSION: Low inspiratory volumes with patchy airspace opacity in the lingula which may reflect atelectasis or early bronchopneumonia. Aortic Atherosclerosis (ICD10-170.0) Electronically Signed   By: Jacqulynn Cadet M.D.   On: 07/30/2018 13:36   US Venous Img Lower Bilateral  Result Date: 07/31/2018 CLINICAL  DATA:  85 year old male with a history of lower extremity edema EXAM: BILATERAL LOWER EXTREMITY VENOUS DOPPLER ULTRASOUND TECHNIQUE: Gray-scale sonography with graded compression, as well as color Doppler and duplex ultrasound were performed to evaluate the lower extremity deep venous systems from the level of the common femoral vein and including the common femoral, femoral, profunda femoral, popliteal and calf veins including the posterior tibial, peroneal and gastrocnemius veins when visible. The superficial great saphenous vein was also interrogated. Spectral Doppler was utilized to evaluate flow at rest and with distal augmentation maneuvers in the common femoral, femoral and popliteal veins. COMPARISON:  None. FINDINGS: RIGHT LOWER EXTREMITY Common Femoral Vein: No evidence of thrombus. Normal compressibility, respiratory phasicity and response to augmentation. Saphenofemoral Junction: No evidence of thrombus. Normal compressibility and flow on color Doppler imaging. Profunda Femoral Vein: No evidence of thrombus. Normal compressibility and flow on color Doppler imaging. Femoral vein/popliteal vein: Flow maintained in the femoral vein and popliteal vein, though the veins are incompressible with thickening. No occlusive DVT. Calf Veins: No evidence of thrombus. Normal compressibility and flow on color Doppler imaging. Superficial Great Saphenous Vein: No evidence of thrombus. Normal compressibility and flow on color Doppler imaging. Other Findings:  None. LEFT LOWER EXTREMITY Common Femoral Vein: No evidence of thrombus. Normal compressibility, respiratory phasicity and response to augmentation. Saphenofemoral Junction: No evidence of thrombus. Normal compressibility and flow on color Doppler imaging. Profunda Femoral Vein: No evidence of thrombus. Normal compressibility and flow on color Doppler imaging. Femoral vein/popliteal vein: Occlusive thrombus of the femoral vein in the mid segment extending distally.  Popliteal vein is patent with flow maintained and compressibility maintained. Calf Veins: No  evidence of thrombus. Normal compressibility and flow on color Doppler imaging. Superficial Great Saphenous Vein: No evidence of thrombus. Normal compressibility and flow on color Doppler imaging. Other Findings:  None. IMPRESSION: Sonographic survey negative for proximal DVT of the bilateral common femoral vein, profunda vein, proximal femoral vein. Duplex is positive for occlusive DVT on the left in the mid and distal femoral vein. Acute on chronic thrombus not excluded. Study is positive for nonocclusive DVT of the right femoral and popliteal veins, potentially chronic. Electronically Signed   By: Corrie Mckusick D.O.   On: 07/31/2018 11:31      Subjective: Patient without complaints.  He has refused to wear his TED hose.  Discharge Exam: Vitals:   07/31/18 2113 08/01/18 0535  BP: (!) 100/57 (!) 115/59  Pulse: 71 60  Resp: 19 19  Temp: (!) 97.5 F (36.4 C) 98.3 F (36.8 C)  SpO2: 97% 94%   Vitals:   07/31/18 0618 07/31/18 1315 07/31/18 2113 08/01/18 0535  BP: 108/65 105/68 (!) 100/57 (!) 115/59  Pulse: 78 76 71 60  Resp:  (!) 21 19 19   Temp: (!) 97.5 F (36.4 C) 97.6 F (36.4 C) (!) 97.5 F (36.4 C) 98.3 F (36.8 C)  TempSrc: Oral Oral Oral Oral  SpO2: 97% 94% 97% 94%  Weight:      Height:       General exam: awake, alert, NAD, a little confused this morning but easily arousable and easily redirected and reoriented.   Respiratory system: BBS CTA.  No increased work of breathing. Cardiovascular system: S1 & S2 heard. No JVD, murmurs, gallops, clicks or pedal edema. Gastrointestinal system: Abdomen is nondistended, soft and nontender. Normal bowel sounds heard. Central nervous system: Alert with some mild confusion.  No focal neurological deficits. Extremities: 1+ edema bilateral LEs. Onychomycosis.    The results of significant diagnostics from this hospitalization (including imaging,  microbiology, ancillary and laboratory) are listed below for reference.     Microbiology: Recent Results (from the past 240 hour(s))  MRSA PCR Screening     Status: Abnormal   Collection Time: 07/31/18  5:48 AM  Result Value Ref Range Status   MRSA by PCR POSITIVE (A) NEGATIVE Final    Comment:        The GeneXpert MRSA Assay (FDA approved for NASAL specimens only), is one component of a comprehensive MRSA colonization surveillance program. It is not intended to diagnose MRSA infection nor to guide or monitor treatment for MRSA infections. RESULT CALLED TO, READ BACK BY AND VERIFIED WITH: HAMILTON @ 1028 ON [id number] BY HENDERSON L Performed at [facility], [address], [place], [place] [address]      Labs: BNP (last 3 results) Recent Labs    07/30/18 1240  BNP 01.7   Basic Metabolic Panel: Recent Labs  Lab 07/30/18 1238 07/31/18 0502 08/01/18 0610  NA 135 135 135  K 2.5* 3.3* 3.0*  CL 84* 93* 93*  CO2 36* 30 30  GLUCOSE 115* 137* 114*  BUN 51* 43* 40*  CREATININE 1.46* 1.21 1.15  CALCIUM 9.2 8.6* 8.6*  MG 2.2 2.1 2.0   Liver Function Tests: Recent Labs  Lab 07/30/18 1238 07/31/18 0502 08/01/18 0610  AST 20 18 18   ALT 24 8 8   ALKPHOS 85 78 67  BILITOT 1.0 1.3* 1.2  PROT 6.8 6.3* 5.8*  ALBUMIN 3.9 3.5 3.1*   No results for input(s): LIPASE, AMYLASE in the last 168 hours. No results for input(s): AMMONIA in  the last 168 hours. CBC: Recent Labs  Lab 07/30/18 1238 07/31/18 0502  WBC 8.8 6.4  NEUTROABS 7.1  --   HGB 12.7* 11.5*  HCT 36.8* 34.8*  MCV 89.8 90.6  PLT 214 183   Cardiac Enzymes: Recent Labs  Lab 07/30/18 1238 07/30/18 1906 07/31/18 0502 07/31/18 1126 08/01/18 0610  TROPONINI 0.08* 0.07* 0.06* 0.07* 0.07*   BNP: Invalid input(s): POCBNP CBG: No results for input(s): GLUCAP in the last 168 hours. D-Dimer No results for input(s): DDIMER in the last 72 hours. Hgb A1c No results for input(s): HGBA1C in the last 72  hours. Lipid Profile No results for input(s): CHOL, HDL, LDLCALC, TRIG, CHOLHDL, LDLDIRECT in the last 72 hours. Thyroid function studies No results for input(s): TSH, T4TOTAL, T3FREE, THYROIDAB in the last 72 hours.  Invalid input(s): FREET3 Anemia work up No results for input(s): VITAMINB12, FOLATE, FERRITIN, TIBC, IRON, RETICCTPCT in the last 72 hours. Urinalysis    Component Value Date/Time   COLORURINE YELLOW 07/30/2018 1720   APPEARANCEUR CLEAR 07/30/2018 1720   LABSPEC <1.005 (L) 07/30/2018 1720   PHURINE 7.0 07/30/2018 1720   GLUCOSEU NEGATIVE 07/30/2018 1720   HGBUR NEGATIVE 07/30/2018 1720   BILIRUBINUR NEGATIVE 07/30/2018 1720   KETONESUR NEGATIVE 07/30/2018 1720   PROTEINUR NEGATIVE 07/30/2018 1720   UROBILINOGEN 0.2 08/18/2013 1357   NITRITE NEGATIVE 07/30/2018 1720   LEUKOCYTESUR NEGATIVE 07/30/2018 1720   Sepsis Labs Invalid input(s): PROCALCITONIN,  WBC,  LACTICIDVEN Microbiology Recent Results (from the past 240 hour(s))  MRSA PCR Screening     Status: Abnormal   Collection Time: 07/31/18  5:48 AM  Result Value Ref Range Status   MRSA by PCR POSITIVE (A) NEGATIVE Final    Comment:        The GeneXpert MRSA Assay (FDA approved for NASAL specimens only), is one component of a comprehensive MRSA colonization surveillance program. It is not intended to diagnose MRSA infection nor to guide or monitor treatment for MRSA infections. RESULT CALLED TO, READ BACK BY AND VERIFIED WITH: HAMILTON @ 8466 ON [id number] BY HENDERSON L Performed at [facility], [address], [place], [place] [address]    Time coordinating discharge:   SIGNED:  Irwin Brakeman, MD  [facility] 08/01/2018, 1:13 PM How to contact the [facility] Attending or Consulting provider [place] or covering provider during after hours [place], for this patient?  1. Check the care team in [facility] and look for a) attending/consulting TR[facility] provider listed and b) the [facility] team listed 2. Log into  [email or web address] and use [place]'s universal password to access. If you do not have the password, please contact the hospital operator. 3. Locate the [facility] provider you are looking for under [facility] and page to a number that you can be directly reached. 4. If you still have difficulty reaching the provider, please page the [facility] (Director on Call) for the Hospitalists listed on amion for assistance.

## 2018-08-01 NOTE — Discharge Instructions (Signed)
·   Eliquis instructions: Take 2 tablets twice daily through 08/06/2018 than on 08/07/2018 start taking 1 tablet twice daily.  Continue Eliquis for at least 3 months to 6 months to treat the DVTs in both legs.  Monitor for any signs or symptoms of bleeding.  Seek medical care or return to ER for any problems.    IMPORTANT INFORMATION: PAY CLOSE ATTENTION   PHYSICIAN DISCHARGE INSTRUCTIONS  Follow with Primary care provider  Caryl Bis, MD  and other consultants as instructed your Hospitalist Physician  SEEK MEDICAL CARE OR RETURN TO EMERGENCY ROOM IF SYMPTOMS COME BACK, WORSEN OR NEW PROBLEM DEVELOPS.   Please note: You were cared for by a hospitalist during your hospital stay. Every effort will be made to forward records to your primary care provider.  You can request that your primary care provider send for your hospital records if they have not received them.  Once you are discharged, your primary care physician will handle any further medical issues. Please note that NO REFILLS for any discharge medications will be authorized once you are discharged, as it is imperative that you return to your primary care physician (or establish a relationship with a primary care physician if you do not have one) for your post hospital discharge needs so that they can reassess your need for medications and monitor your lab values.  Please get a complete blood count and chemistry panel checked by your Primary MD at your next visit, and again as instructed by your Primary MD.  Get Medicines reviewed and adjusted: Please take all your medications with you for your next visit with your Primary MD  Laboratory/radiological data: Please request your Primary MD to go over all hospital tests and procedure/radiological results at the follow up, please ask your primary care provider to get all Hospital records sent to his/her office.  In some cases, they will be blood work, cultures and biopsy results pending  at the time of your discharge. Please request that your primary care provider follow up on these results.  If you are diabetic, please bring your blood sugar readings with you to your follow up appointment with primary care.    Please call and make your follow up appointments as soon as possible.    Also Note the following: If you experience worsening of your admission symptoms, develop shortness of breath, life threatening emergency, suicidal or homicidal thoughts you must seek medical attention immediately by calling 911 or calling your MD immediately  if symptoms less severe.  You must read complete instructions/literature along with all the possible adverse reactions/side effects for all the Medicines you take and that have been prescribed to you. Take any new Medicines after you have completely understood and accpet all the possible adverse reactions/side effects.   Do not drive when taking Pain medications or sleeping medications (Benzodiazepines)  Do not take more than prescribed Pain, Sleep and Anxiety Medications. It is not advisable to combine anxiety,sleep and pain medications without talking with your primary care practitioner  Special Instructions: If you have smoked or chewed Tobacco  in the last 2 yrs please stop smoking, stop any regular Alcohol  and or any Recreational drug use.  Wear Seat belts while driving.

## 2018-08-01 NOTE — NC FL2 (Signed)
[place] LEVEL OF CARE SCREENING TOOL     IDENTIFICATION  Patient Name: Julian Ramirez Birthdate: [birth year] Sex: male Admission Date (Current Location): 07/30/2018  [facility] and [place] Number:  [employer] and Address:  [place] [address], [place]      Provider Number: [id number]  Attending Physician Name and Address:  Murlean Iba, MD  Relative Name and Phone Number:       Current Level of Care: Other (Comment)(observastion) Recommended Level of Care: [place] Prior Approval Number:    Date Approved/Denied:   PASRR Number:    Discharge Plan: SNF    Current Diagnoses: Patient Active Problem List   Diagnosis Date Noted  . Pressure injury of skin 07/31/2018  . Dehydration 07/30/2018  . Bilateral leg edema 07/30/2018  . Elevated troponin 07/30/2018  . Chronic pain 07/30/2018  . Hypotension 07/30/2018  . AKI (acute kidney injury) ([place]) 07/30/2018  . Common bile duct dilatation   . Intrahepatic bile duct dilation   . Serum total bilirubin elevated   . Hypokalemia   . Varicose veins of bilateral lower extremities with other complications 45/40/9811  . Rhabdomyolysis 06/29/2013  . Fall 06/29/2013  . Generalized weakness 06/29/2013  . Acute renal failure ([place]) 06/29/2013  . Leukocytosis 06/29/2013  . Anemia 06/29/2013  . Thrombocytopenia ([place]) 06/29/2013  . Weakness 06/29/2013  . Hypertension   . PD (Parkinson's disease) ([place])   . Neuropathy   . Edema     Orientation RESPIRATION BLADDER Height & Weight     Time, Self, Situation, Place  Normal Incontinent Weight: 193 lb 4.8 oz (87.7 kg) Height:  6' (182.9 cm)  BEHAVIORAL SYMPTOMS/MOOD NEUROLOGICAL BOWEL NUTRITION STATUS      Continent Diet(soft, aspiration precautions)  AMBULATORY STATUS COMMUNICATION OF NEEDS Skin   Extensive Assist(uses wheelchair. Can transfer independently. ) Verbally PU Stage and Appropriate Care(right  heel )                       Personal Care Assistance Level of Assistance  Bathing, Feeding, Dressing Bathing Assistance: Limited assistance Feeding assistance: Independent Dressing Assistance: Limited assistance     Functional Limitations Info  Sight, Hearing, Speech Sight Info: Adequate Hearing Info: Adequate Speech Info: Adequate    SPECIAL CARE FACTORS FREQUENCY                       Contractures Contractures Info: Not present    Additional Factors Info  Code Status, Allergies, Psychotropic Code Status Info: DNR Allergies Info: Penicillins, Peanuts, Wheat Bran  Psychotropic Info: Cymbalta, Desyrel         Current Medications (08/01/2018):  This is the current hospital active medication list Current Facility-Administered Medications  Medication Dose Route Frequency Provider Last Rate Last Dose  . 0.9 % NaCl with KCl 20 mEq/ L  infusion   Intravenous Continuous Johnson, Clanford L, MD 50 mL/hr at 08/01/18 0549    . acetaminophen (TYLENOL) tablet 650 mg  650 mg Oral Q6H PRN Johnson, Clanford L, MD      . antiseptic oral rinse (BIOTENE) solution 15 mL  15 mL Mouth Rinse PRN Johnson, Clanford L, MD      . apixaban (ELIQUIS) tablet 10 mg  10 mg Oral BID Johnson, Clanford L, MD   10 mg at 08/01/18 0926   Followed by  . [START ON 08/07/2018] apixaban (ELIQUIS) tablet 5 mg  5 mg  Oral BID Johnson, Clanford L, MD      . baclofen (LIORESAL) tablet 5 mg  5 mg Oral BID Wynetta Emery, Clanford L, MD   5 mg at 08/01/18 0925  . carbidopa-levodopa (SINEMET IR) 25-100 MG per tablet immediate release 2 tablet  2 tablet Oral TID Murlean Iba, MD   2 tablet at 08/01/18 0926  . Chlorhexidine Gluconate Cloth 2 % PADS 6 each  6 each Topical Q0600 Murlean Iba, MD   6 each at 08/01/18 0549  . dexamethasone (DECADRON) tablet 4 mg  4 mg Oral Daily Johnson, Clanford L, MD   4 mg at 08/01/18 0926  . docusate sodium (COLACE) capsule 100 mg  100 mg Oral Daily Johnson, Clanford L, MD    100 mg at 08/01/18 0925  . DULoxetine (CYMBALTA) DR capsule 30 mg  30 mg Oral Daily Johnson, Clanford L, MD   30 mg at 08/01/18 0924  . feeding supplement (PRO-STAT SUGAR FREE 64) liquid 30 mL  30 mL Oral Daily Johnson, Clanford L, MD   30 mL at 07/31/18 0806  . ferrous sulfate tablet 325 mg  325 mg Oral Q breakfast Johnson, Clanford L, MD   325 mg at 08/01/18 0900  . finasteride (PROSCAR) tablet 5 mg  5 mg Oral Daily Johnson, Clanford L, MD   5 mg at 08/01/18 0925  . guaiFENesin (ROBITUSSIN) 100 MG/5ML solution 200 mg  200 mg Oral TID PRN Wynetta Emery, Clanford L, MD      . multivitamin with minerals tablet 1 tablet  1 tablet Oral Daily Wynetta Emery, Clanford L, MD   1 tablet at 08/01/18 0925  . mupirocin ointment (BACTROBAN) 2 % 1 application  1 application Nasal BID Murlean Iba, MD   1 application at 96/[birth year] [phone]  . ondansetron (ZOFRAN) tablet 4 mg  4 mg Oral Q6H PRN Johnson, Clanford L, MD       Or  . ondansetron (ZOFRAN) injection 4 mg  4 mg Intravenous Q6H PRN Johnson, Clanford L, MD      . oxyCODONE (Oxy IR/ROXICODONE) immediate release tablet 5 mg  5 mg Oral Q8H Johnson, Clanford L, MD   5 mg at 08/01/18 0925  . polyethylene glycol (MIRALAX / GLYCOLAX) packet 17 g  17 g Oral Daily Johnson, Clanford L, MD   17 g at 07/31/18 2119  . potassium chloride SA (K-DUR,KLOR-CON) CR tablet 60 mEq  60 mEq Oral Daily Johnson, Clanford L, MD   60 mEq at 08/01/18 4174  . pregabalin (LYRICA) capsule 75 mg  75 mg Oral TID Wynetta Emery, Clanford L, MD   75 mg at 08/01/18 0924  . tamsulosin (FLOMAX) capsule 0.4 mg  0.4 mg Oral QHS Johnson, Clanford L, MD   0.4 mg at 07/31/18 2153  . traZODone (DESYREL) tablet 50 mg  50 mg Oral QHS Johnson, Clanford L, MD   50 mg at 07/31/18 2153     Discharge Medications: Please see discharge summary for a list of discharge medications.  Relevant Imaging Results:  Relevant Lab Results:   Additional Information    Rihan Schueler, Clydene Pugh, LCSW

## 2018-08-01 NOTE — Progress Notes (Signed)
Report called to [facility] at [facility] and EMS has been called for transport.

## 2018-08-01 NOTE — Clinical Social Work Note (Signed)
Clinical Social Work Assessment  Patient Details  Name: Julian Ramirez MRN: [id number] Date of Birth: 1926/09/27  Date of referral:  08/01/18               Reason for consult:  Other (Comment Required)(Patient is a long term resident at [facility].)                Permission sought to share information with:    Permission granted to share information::     Name::        Agency::  Julian Ramirez at [facility]   Relationship::     Contact Information:     Housing/Transportation Living arrangements for the past 2 months:  [place] of Information:  Facility Patient Interpreter Needed:  None Criminal Activity/Legal Involvement Pertinent to Current Situation/Hospitalization:  No - Comment as needed Significant Relationships:  Adult Children Lives with:  Facility Resident Do you feel safe going back to the place where you live?  Yes Need for family participation in patient care:  Yes (Comment)  Care giving concerns:  None identified. Facility resident.     Social Worker assessment / plan:  Patient is a long term resident at [facility]. He uses a wheelchair and transfers independently. He is incontinent of bladder. Patient receives assistance with ADLS from staff. Patient's son is very supportive. LCSW advised Julian Ramirez that patient was discharging today.   Employment status:  Retired Nurse, adult PT Recommendations:    Information / Referral to community resources:     Patient/Family's Response to care:  Patient is a long term resident.   Patient/Family's Understanding of and Emotional Response to Diagnosis, Current Treatment, and Prognosis:  Patient's son was informed of patient's diagnosis, treatment and prognosis by attending.   Emotional Assessment Appearance:  Appears stated age Attitude/Demeanor/Rapport:    Affect (typically observed):  Accepting, Calm Orientation:  Oriented to Self, Oriented to Place, Oriented to  Time,  Oriented to Situation Alcohol / Substance use:  Not Applicable Psych involvement (Current and /or in the community):  No (Comment)  Discharge Needs  Concerns to be addressed:  Other (Comment Required(Return to facility) Readmission within the last 30 days:  No Current discharge risk:  None Barriers to Discharge:  No Barriers Identified   Ihor Gully, LCSW 08/01/2018, 11:13 AM

## 2018-08-01 NOTE — Care Management CC44 (Signed)
Condition Code 44 Documentation Completed  Patient Details  Name: Julian Ramirez MRN: [id number] Date of Birth: Nov 26, 1926   Condition Code 44 given:  Yes Patient signature on Condition Code 44 notice:  Yes Documentation of 2 MD's agreement:  Yes Code 44 added to claim:  Yes    Sherald Barge, RN 08/01/2018, 1:15 PM

## 2018-08-01 NOTE — Progress Notes (Signed)
Patient discharged home today per MD orders. Patient vital signs WDL. IV removed and site WDL. Discharge Instructions including follow up appointments, medications, and education reviewed with Leigh at [facility]. Patient is transported out via EMS to [facility].

## 2019-02-13 ENCOUNTER — Telehealth: Payer: Medicare Other | Admitting: Neurology

## 2019-02-13 ENCOUNTER — Other Ambulatory Visit: Payer: Self-pay

## 2019-02-13 ENCOUNTER — Encounter: Payer: Self-pay | Admitting: Neurology

## 2019-02-13 NOTE — Progress Notes (Signed)
Pt had phone visit today.  MA spent over 30 min on phone with SNF trying to get meds updated and trying to get someone to physically get pt on the phone.  They kept transferring the phone call, and no one would pick up.  After multiple calls, my medical assistant asked not to be transferred, but rather have somebody physically walk back.  Someone else then picked up his telephone, at which point the call was transferred to me.  I picked up the telephone, identified myself as well as the patient's name, but he was talking to whomever was with him and she kept saying "you need to talk to who is on the phone" and then the line went dead.  [employer], pt lives at a SNF that has very recent outbreak of Covid (30 pts/staff per news reports) so would prefer video visit for pts and our safety.  Can they accomodate this (not phone visit since the patient and staff did not seem to be able to do this).  If so, please schedule for a wed/friday.

## 2019-02-13 NOTE — Progress Notes (Signed)
[place] to try getting this scheduled  Spoke with [facility] receptionist she tried several times to get someone on the phone to schedule virtual visit unable able to get someone on the phone. Not sure if virtual visit will be a option. I was on the phone for almost 30ins tryuing to get someone to help with his telephone visit.

## 2019-02-13 NOTE — Progress Notes (Signed)
Hinton Dyer, would you please send facility/patient a letter to schedule a video visit since we cannot get a hold of them despite many calls.  We can only get a hold of the secretary and her only advice was "keep calling back."

## 2019-03-23 DEATH — deceased
